# Patient Record
Sex: Female | Born: 1985 | Race: Black or African American | Hispanic: No | Marital: Single | Smoking: Current every day smoker
Health system: Southern US, Community
[De-identification: ages and names within clinical notes are randomized; demographics above are authoritative.]

## PROBLEM LIST (undated history)

## (undated) DIAGNOSIS — D649 Anemia, unspecified: Secondary | ICD-10-CM

## (undated) DIAGNOSIS — L309 Dermatitis, unspecified: Secondary | ICD-10-CM

## (undated) HISTORY — DX: Dermatitis, unspecified: L30.9

## (undated) HISTORY — PX: WISDOM TOOTH EXTRACTION: SHX21

## (undated) HISTORY — PX: NO PAST SURGERIES: SHX2092

---
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
## 2003-01-10 ENCOUNTER — Emergency Department (HOSPITAL_COMMUNITY): Admission: EM | Admit: 2003-01-10 | Discharge: 2003-01-10 | Payer: Self-pay

## 2003-01-12 ENCOUNTER — Emergency Department (HOSPITAL_COMMUNITY): Admission: EM | Admit: 2003-01-12 | Discharge: 2003-01-12 | Payer: Self-pay | Admitting: Emergency Medicine

## 2003-01-16 ENCOUNTER — Emergency Department (HOSPITAL_COMMUNITY): Admission: EM | Admit: 2003-01-16 | Discharge: 2003-01-16 | Payer: Self-pay | Admitting: Emergency Medicine

## 2003-01-16 ENCOUNTER — Encounter: Payer: Self-pay | Admitting: Emergency Medicine

## 2003-01-17 ENCOUNTER — Emergency Department (HOSPITAL_COMMUNITY): Admission: EM | Admit: 2003-01-17 | Discharge: 2003-01-17 | Payer: Self-pay | Admitting: Emergency Medicine

## 2003-01-19 ENCOUNTER — Emergency Department (HOSPITAL_COMMUNITY): Admission: EM | Admit: 2003-01-19 | Discharge: 2003-01-19 | Payer: Self-pay | Admitting: Emergency Medicine

## 2003-02-09 ENCOUNTER — Emergency Department (HOSPITAL_COMMUNITY): Admission: EM | Admit: 2003-02-09 | Discharge: 2003-02-09 | Payer: Self-pay | Admitting: Emergency Medicine

## 2004-02-04 ENCOUNTER — Emergency Department (HOSPITAL_COMMUNITY): Admission: EM | Admit: 2004-02-04 | Discharge: 2004-02-04 | Payer: Self-pay | Admitting: *Deleted

## 2005-10-13 ENCOUNTER — Emergency Department (HOSPITAL_COMMUNITY): Admission: EM | Admit: 2005-10-13 | Discharge: 2005-10-13 | Payer: Self-pay | Admitting: Emergency Medicine

## 2006-09-22 ENCOUNTER — Emergency Department (HOSPITAL_COMMUNITY): Admission: EM | Admit: 2006-09-22 | Discharge: 2006-09-22 | Payer: Self-pay | Admitting: Emergency Medicine

## 2006-10-31 ENCOUNTER — Inpatient Hospital Stay (HOSPITAL_COMMUNITY): Admission: AD | Admit: 2006-10-31 | Discharge: 2006-10-31 | Payer: Self-pay | Admitting: Obstetrics and Gynecology

## 2006-11-10 ENCOUNTER — Inpatient Hospital Stay (HOSPITAL_COMMUNITY): Admission: AD | Admit: 2006-11-10 | Discharge: 2006-11-10 | Payer: Self-pay | Admitting: Gynecology

## 2006-11-10 ENCOUNTER — Ambulatory Visit (HOSPITAL_COMMUNITY): Admission: RE | Admit: 2006-11-10 | Discharge: 2006-11-10 | Payer: Self-pay | Admitting: Obstetrics and Gynecology

## 2007-02-16 ENCOUNTER — Emergency Department (HOSPITAL_COMMUNITY): Admission: EM | Admit: 2007-02-16 | Discharge: 2007-02-16 | Payer: Self-pay | Admitting: *Deleted

## 2007-04-24 ENCOUNTER — Emergency Department (HOSPITAL_COMMUNITY): Admission: EM | Admit: 2007-04-24 | Discharge: 2007-04-24 | Payer: Self-pay | Admitting: Family Medicine

## 2007-12-12 ENCOUNTER — Emergency Department (HOSPITAL_COMMUNITY): Admission: EM | Admit: 2007-12-12 | Discharge: 2007-12-12 | Payer: Self-pay | Admitting: Emergency Medicine

## 2008-02-24 ENCOUNTER — Emergency Department (HOSPITAL_COMMUNITY): Admission: EM | Admit: 2008-02-24 | Discharge: 2008-02-24 | Payer: Self-pay | Admitting: Family Medicine

## 2008-12-20 ENCOUNTER — Emergency Department (HOSPITAL_COMMUNITY): Admission: EM | Admit: 2008-12-20 | Discharge: 2008-12-20 | Payer: Self-pay | Admitting: Family Medicine

## 2010-03-13 ENCOUNTER — Ambulatory Visit: Payer: Self-pay | Admitting: Nurse Practitioner

## 2010-03-13 ENCOUNTER — Inpatient Hospital Stay (HOSPITAL_COMMUNITY): Admission: AD | Admit: 2010-03-13 | Discharge: 2010-03-13 | Payer: Self-pay | Admitting: Obstetrics & Gynecology

## 2010-03-15 ENCOUNTER — Inpatient Hospital Stay (HOSPITAL_COMMUNITY): Admission: AD | Admit: 2010-03-15 | Discharge: 2010-03-15 | Payer: Self-pay | Admitting: Family Medicine

## 2010-03-15 ENCOUNTER — Ambulatory Visit: Payer: Self-pay | Admitting: Obstetrics and Gynecology

## 2010-03-20 ENCOUNTER — Ambulatory Visit (HOSPITAL_COMMUNITY): Admission: RE | Admit: 2010-03-20 | Discharge: 2010-03-20 | Payer: Self-pay | Admitting: Obstetrics & Gynecology

## 2010-03-20 ENCOUNTER — Ambulatory Visit: Payer: Self-pay | Admitting: Obstetrics and Gynecology

## 2010-03-27 ENCOUNTER — Inpatient Hospital Stay (HOSPITAL_COMMUNITY): Admission: RE | Admit: 2010-03-27 | Discharge: 2010-03-27 | Payer: Self-pay | Admitting: Obstetrics & Gynecology

## 2010-03-27 ENCOUNTER — Ambulatory Visit: Payer: Self-pay | Admitting: Nurse Practitioner

## 2010-04-17 DEATH — deceased

## 2010-05-21 ENCOUNTER — Emergency Department (HOSPITAL_COMMUNITY)
Admission: EM | Admit: 2010-05-21 | Discharge: 2010-05-21 | Payer: Self-pay | Source: Home / Self Care | Admitting: Emergency Medicine

## 2010-06-14 ENCOUNTER — Emergency Department (HOSPITAL_COMMUNITY)
Admission: EM | Admit: 2010-06-14 | Discharge: 2010-06-14 | Payer: Self-pay | Source: Home / Self Care | Admitting: Emergency Medicine

## 2010-06-18 ENCOUNTER — Emergency Department (HOSPITAL_COMMUNITY)
Admission: EM | Admit: 2010-06-18 | Discharge: 2010-06-18 | Payer: Self-pay | Source: Home / Self Care | Admitting: Family Medicine

## 2010-06-25 ENCOUNTER — Emergency Department (HOSPITAL_COMMUNITY)
Admission: EM | Admit: 2010-06-25 | Discharge: 2010-06-25 | Payer: Self-pay | Source: Home / Self Care | Admitting: Family Medicine

## 2010-07-31 ENCOUNTER — Inpatient Hospital Stay (HOSPITAL_COMMUNITY)
Admission: AD | Admit: 2010-07-31 | Discharge: 2010-07-31 | Disposition: A | Discharge status: Home / Self Care | Payer: Medicaid Other | Source: Ambulatory Visit | Attending: Obstetrics and Gynecology | Admitting: Obstetrics and Gynecology

## 2010-07-31 ENCOUNTER — Inpatient Hospital Stay (HOSPITAL_COMMUNITY): Payer: Medicaid Other

## 2010-07-31 ENCOUNTER — Encounter (HOSPITAL_COMMUNITY): Payer: Self-pay | Admitting: Radiology

## 2010-07-31 DIAGNOSIS — R109 Unspecified abdominal pain: Secondary | ICD-10-CM | POA: Insufficient documentation

## 2010-07-31 DIAGNOSIS — O98819 Other maternal infectious and parasitic diseases complicating pregnancy, unspecified trimester: Secondary | ICD-10-CM

## 2010-07-31 DIAGNOSIS — A5901 Trichomonal vulvovaginitis: Secondary | ICD-10-CM | POA: Insufficient documentation

## 2010-07-31 LAB — CBC
MCH: 31.9 pg (ref 26.0–34.0)
MCHC: 33.7 g/dL (ref 30.0–36.0)
Platelets: 197 10*3/uL (ref 150–400)
RBC: 3.7 MIL/uL — ABNORMAL LOW (ref 3.87–5.11)
RDW: 13 % (ref 11.5–15.5)

## 2010-07-31 LAB — URINALYSIS, ROUTINE W REFLEX MICROSCOPIC
Bilirubin Urine: NEGATIVE
Ketones, ur: 15 mg/dL — AB
Nitrite: NEGATIVE
Protein, ur: NEGATIVE mg/dL
Specific Gravity, Urine: 1.025 (ref 1.005–1.030)
Urobilinogen, UA: 0.2 mg/dL (ref 0.0–1.0)

## 2010-07-31 LAB — WET PREP, GENITAL: Yeast Wet Prep HPF POC: NONE SEEN

## 2010-08-28 LAB — POCT PREGNANCY, URINE: Preg Test, Ur: POSITIVE

## 2010-08-30 LAB — URINALYSIS, ROUTINE W REFLEX MICROSCOPIC
Nitrite: NEGATIVE
Specific Gravity, Urine: 1.025 (ref 1.005–1.030)
Urobilinogen, UA: 0.2 mg/dL (ref 0.0–1.0)
pH: 6 (ref 5.0–8.0)

## 2010-08-30 LAB — CBC
HCT: 38.3 % (ref 36.0–46.0)
Hemoglobin: 13.1 g/dL (ref 12.0–15.0)
MCH: 33.8 pg (ref 26.0–34.0)
MCHC: 34.2 g/dL (ref 30.0–36.0)
RBC: 3.88 MIL/uL (ref 3.87–5.11)

## 2010-08-30 LAB — GC/CHLAMYDIA PROBE AMP, GENITAL
Chlamydia, DNA Probe: NEGATIVE
GC Probe Amp, Genital: NEGATIVE

## 2010-08-30 LAB — ABO/RH: ABO/RH(D): B POS

## 2010-09-04 LAB — HEPATITIS B SURFACE ANTIGEN: Hepatitis B Surface Ag: NEGATIVE

## 2010-09-04 LAB — RPR: RPR: NONREACTIVE

## 2010-09-04 LAB — RUBELLA ANTIBODY, IGM: Rubella: IMMUNE

## 2011-01-03 LAB — STREP B DNA PROBE: GBS: POSITIVE

## 2011-01-23 ENCOUNTER — Inpatient Hospital Stay (HOSPITAL_COMMUNITY): Payer: Medicaid Other | Admitting: Anesthesiology

## 2011-01-23 ENCOUNTER — Encounter (HOSPITAL_COMMUNITY): Payer: Self-pay | Admitting: *Deleted

## 2011-01-23 ENCOUNTER — Inpatient Hospital Stay (HOSPITAL_COMMUNITY)
Admission: AD | Admit: 2011-01-23 | Discharge: 2011-01-25 | DRG: 775 | Disposition: A | Discharge status: Home / Self Care | Payer: Medicaid Other | Source: Ambulatory Visit | Attending: Obstetrics | Admitting: Obstetrics

## 2011-01-23 ENCOUNTER — Encounter (HOSPITAL_COMMUNITY): Payer: Self-pay | Admitting: Anesthesiology

## 2011-01-23 DIAGNOSIS — O99892 Other specified diseases and conditions complicating childbirth: Principal | ICD-10-CM | POA: Diagnosis present

## 2011-01-23 DIAGNOSIS — IMO0002 Reserved for concepts with insufficient information to code with codable children: Secondary | ICD-10-CM | POA: Diagnosis present

## 2011-01-23 DIAGNOSIS — Z22330 Carrier of Group B streptococcus: Secondary | ICD-10-CM

## 2011-01-23 DIAGNOSIS — Z3490 Encounter for supervision of normal pregnancy, unspecified, unspecified trimester: Secondary | ICD-10-CM

## 2011-01-23 HISTORY — DX: Anemia, unspecified: D64.9

## 2011-01-23 LAB — CBC
MCH: 33.6 pg (ref 26.0–34.0)
MCHC: 34.1 g/dL (ref 30.0–36.0)
Platelets: 160 10*3/uL (ref 150–400)
RBC: 3.81 MIL/uL — ABNORMAL LOW (ref 3.87–5.11)
RDW: 13 % (ref 11.5–15.5)

## 2011-01-23 LAB — RPR: RPR Ser Ql: NONREACTIVE

## 2011-01-23 MED ORDER — FENTANYL 2.5 MCG/ML BUPIVACAINE 1/10 % EPIDURAL INFUSION (WH - ANES)
14.0000 mL/h | INTRAMUSCULAR | Status: DC
  Administered 2011-01-23: 14 mL/h via EPIDURAL
  Filled 2011-01-23 (×2): qty 60

## 2011-01-23 MED ORDER — LORATADINE 10 MG PO TABS
10.0000 mg | ORAL_TABLET | Freq: Every day | ORAL | Status: DC | PRN
  Filled 2011-01-23: qty 1

## 2011-01-23 MED ORDER — OXYTOCIN 20 UNITS IN LACTATED RINGERS INFUSION - SIMPLE
1.0000 m[IU]/min | INTRAVENOUS | Status: DC
  Administered 2011-01-23: 1 m[IU]/min via INTRAVENOUS

## 2011-01-23 MED ORDER — CLINDAMYCIN PHOSPHATE 900 MG/50ML IV SOLN
900.0000 mg | Freq: Three times a day (TID) | INTRAVENOUS | Status: DC
  Administered 2011-01-23 (×2): 900 mg via INTRAVENOUS
  Filled 2011-01-23 (×4): qty 50

## 2011-01-23 MED ORDER — OXYTOCIN 20 UNITS IN LACTATED RINGERS INFUSION - SIMPLE
125.0000 mL/h | Freq: Once | INTRAVENOUS | Status: AC
  Administered 2011-01-24: 999 mL/h via INTRAVENOUS
  Filled 2011-01-23: qty 1000

## 2011-01-23 MED ORDER — PHENYLEPHRINE 40 MCG/ML (10ML) SYRINGE FOR IV PUSH (FOR BLOOD PRESSURE SUPPORT)
80.0000 ug | PREFILLED_SYRINGE | INTRAVENOUS | Status: DC | PRN
  Filled 2011-01-23: qty 5

## 2011-01-23 MED ORDER — LACTATED RINGERS IV SOLN
500.0000 mL | Freq: Once | INTRAVENOUS | Status: AC
  Administered 2011-01-23: 1000 mL via INTRAVENOUS

## 2011-01-23 MED ORDER — FLEET ENEMA 7-19 GM/118ML RE ENEM: 1.0000 | ENEMA | RECTAL | Status: DC | PRN

## 2011-01-23 MED ORDER — DIPHENHYDRAMINE HCL 50 MG/ML IJ SOLN: 12.5000 mg | INTRAMUSCULAR | Status: DC | PRN

## 2011-01-23 MED ORDER — LACTATED RINGERS IV SOLN: 500.0000 mL | INTRAVENOUS | Status: DC | PRN

## 2011-01-23 MED ORDER — OXYTOCIN 10 UNIT/ML IJ SOLN: 20.0000 [IU] | INTRAMUSCULAR | Status: DC

## 2011-01-23 MED ORDER — LACTATED RINGERS IV SOLN
INTRAVENOUS | Status: DC
  Administered 2011-01-23 (×2): via INTRAVENOUS

## 2011-01-23 MED ORDER — EPHEDRINE 5 MG/ML INJ
10.0000 mg | INTRAVENOUS | Status: DC | PRN
  Filled 2011-01-23 (×2): qty 4

## 2011-01-23 MED ORDER — CITRIC ACID-SODIUM CITRATE 334-500 MG/5ML PO SOLN: 30.0000 mL | ORAL | Status: DC | PRN

## 2011-01-23 MED ORDER — PHENYLEPHRINE 40 MCG/ML (10ML) SYRINGE FOR IV PUSH (FOR BLOOD PRESSURE SUPPORT)
80.0000 ug | PREFILLED_SYRINGE | INTRAVENOUS | Status: DC | PRN
  Filled 2011-01-23 (×2): qty 5

## 2011-01-23 MED ORDER — EPHEDRINE 5 MG/ML INJ
10.0000 mg | INTRAVENOUS | Status: DC | PRN
  Filled 2011-01-23: qty 4

## 2011-01-23 MED ORDER — PRENATAL PLUS 27-1 MG PO TABS
1.0000 | ORAL_TABLET | Freq: Every day | ORAL | Status: DC
  Administered 2011-01-24: 1 via ORAL
  Filled 2011-01-23: qty 1

## 2011-01-23 MED ORDER — NALBUPHINE SYRINGE 5 MG/0.5 ML
10.0000 mg | INJECTION | INTRAMUSCULAR | Status: DC | PRN
  Filled 2011-01-23: qty 1

## 2011-01-23 MED ORDER — NALBUPHINE SYRINGE 5 MG/0.5 ML
10.0000 mg | INJECTION | Freq: Four times a day (QID) | INTRAMUSCULAR | Status: DC | PRN
  Filled 2011-01-23: qty 1

## 2011-01-23 MED ORDER — OXYCODONE-ACETAMINOPHEN 5-325 MG PO TABS: 2.0000 | ORAL_TABLET | ORAL | Status: DC | PRN

## 2011-01-23 MED ORDER — LIDOCAINE HCL 1.5 % IJ SOLN
INTRAMUSCULAR | Status: DC | PRN
  Administered 2011-01-23 (×2): 5 mL via EPIDURAL
  Administered 2011-01-23: 2 mL via EPIDURAL

## 2011-01-23 MED ORDER — ONDANSETRON HCL 4 MG/2ML IJ SOLN: 4.0000 mg | Freq: Four times a day (QID) | INTRAMUSCULAR | Status: DC | PRN

## 2011-01-23 MED ORDER — HYDROXYZINE HCL 50 MG/ML IM SOLN
50.0000 mg | Freq: Four times a day (QID) | INTRAMUSCULAR | Status: DC | PRN
  Filled 2011-01-23: qty 1

## 2011-01-23 MED ORDER — LIDOCAINE HCL (PF) 1 % IJ SOLN
30.0000 mL | INTRAMUSCULAR | Status: DC | PRN
  Filled 2011-01-23 (×2): qty 30

## 2011-01-23 MED ORDER — ACETAMINOPHEN 325 MG PO TABS: 650.0000 mg | ORAL_TABLET | ORAL | Status: DC | PRN

## 2011-01-23 MED ORDER — TERBUTALINE SULFATE 1 MG/ML IJ SOLN: 0.2500 mg | Freq: Once | INTRAMUSCULAR | Status: AC | PRN

## 2011-01-23 MED ORDER — IBUPROFEN 600 MG PO TABS: 600.0000 mg | ORAL_TABLET | Freq: Four times a day (QID) | ORAL | Status: DC | PRN

## 2011-01-23 NOTE — Progress Notes (Signed)
Dr. Clearance Coots notified pt in MAU w/ruptured membranes, bloody clear fluid, efm tracing reactive, ctx's irregular, cervix 1/50/-3, orders for low dose pitocin, not to exceed , nubain 10mg  iv q2h, nubain im q6h, vistaril 50mg  im q6h, epidural in active labor upon request, treat for GBS positive. MD office faxing pt's prenatal record.

## 2011-01-23 NOTE — Progress Notes (Signed)
HAD PAIN AND PRESSURE YESTERDAY-   WHEN TO SLEEP- NO PAIN AND THIS MORNIN- PAIN .  NO PAIN NOW.

## 2011-01-23 NOTE — Progress Notes (Signed)
Around 4 to 5:00 a.m. Felt very wet in underwear, again leaked around 8:30 a.m. Light pink tinged, did put on a pad, cramping and pressure feeling

## 2011-01-23 NOTE — Plan of Care (Signed)
Problem: Consults Goal: Birthing Suites Patient Information Press F2 to bring up selections list Outcome: Completed/Met Date Met:  01/23/11  Pt 37-[redacted] weeks EGA and Inpatient induction

## 2011-01-23 NOTE — Progress Notes (Signed)
LYNSIE MCWATTERS is a 25 y.o. G4P1021 at [redacted]w[redacted]d by LMP admitted for rupture of membranes  Subjective:   Objective: BP 124/64  Pulse 72  Temp(Src) 98.4 F (36.9 C) (Oral)  Resp 20  Ht 5\' 6"  (1.676 m)  Wt 107.956 kg (238 lb)  BMI 38.41 kg/m2  LMP 04/25/2010      FHT:  FHR: 150 bpm, variability: moderate,  accelerations:  Present,  decelerations:  Absent UC:   irregular, every 2 - 5 minutes SVE:   Dilation: 1 Effacement (%): 50 Station: -3 Exam by:: Dr. Dimple Casey  Labs: Lab Results  Component Value Date   WBC 7.2 01/23/2011   HGB 12.8 01/23/2011   HCT 37.5 01/23/2011   MCV 98.4 01/23/2011   PLT 160 01/23/2011    Assessment / Plan: Augmentation of labor, progressing well  Labor: Progressing normally Preeclampsia:  n/a Fetal Wellbeing:  Category I Pain Control:  Nubain and Vistaril I/D:  n/a Anticipated MOD:  NSVD  HARPER,CHARLES A 01/23/2011, 5:39 PM

## 2011-01-23 NOTE — Progress Notes (Signed)
PT SAYS  AT 0500- AWOKE  AND HER PERINEUM WAS WET-.Marland Kitchen THEN  AT 0830- GETTING DRESSED FOR WORK - GUSH CAME OUT- THEN SAW PINK D/C.  CALLED DR.    NO SEX WITHIN 24 HRS.  VE IN OFFICE- 2 WEEKS AGO-  1 CM.  HAS APPOINTMENT ON Friday.

## 2011-01-23 NOTE — Anesthesia Preprocedure Evaluation (Signed)
Anesthesia Evaluation  Name, MR# and DOB Patient awake  General Assessment Comment  Reviewed: Allergy & Precautions, H&P , NPO status , Patient's Chart, lab work & pertinent test results, reviewed documented beta blocker date and time   History of Anesthesia Complications Negative for: history of anesthetic complications  Airway Mallampati: III TM Distance: >3 FB Neck ROM: full    Dental  (+) Teeth Intact   Pulmonary  clear to auscultation  breath sounds clear to auscultation none    Cardiovascular regular Normal    Neuro/Psych Negative Neurological ROS  Negative Psych ROS  GI/Hepatic/Renal negative Liver ROS, and negative Renal ROS (+)  GERD Medicated     Endo/Other  (+)   Morbid obesity  Abdominal   Musculoskeletal   Hematology negative hematology ROS (+)   Peds  Reproductive/Obstetrics (+) Pregnancy    Anesthesia Other Findings             Anesthesia Physical Anesthesia Plan  ASA: III  Anesthesia Plan: Epidural   Post-op Pain Management:    Induction:   Airway Management Planned:   Additional Equipment:   Intra-op Plan:   Post-operative Plan:   Informed Consent: I have reviewed the patients History and Physical, chart, labs and discussed the procedure including the risks, benefits and alternatives for the proposed anesthesia with the patient or authorized representative who has indicated his/her understanding and acceptance.     Plan Discussed with:   Anesthesia Plan Comments:         Anesthesia Quick Evaluation

## 2011-01-23 NOTE — Progress Notes (Signed)
I was asked to do a speculum exam to r/o SROM. Fern test was positive. Cervix 1/50/-3.

## 2011-01-23 NOTE — H&P (Signed)
Elizabeth Alvarez is a 25 y.o. female presenting for SROM. Maternal Medical History:  Reason for admission: Reason for admission: rupture of membranes.  Contractions: Onset was 3-5 hours ago.   Frequency: regular.   Duration is approximately 1 minute.   Perceived severity is mild.    Fetal activity: Perceived fetal activity is normal.   Last perceived fetal movement was within the past hour.    Prenatal complications: no prenatal complications Prenatal Complications - Diabetes: none.    OB History    Grav Para Term Preterm Abortions TAB SAB Ect Mult Living   4 1 1  2 1 1   1      Past Medical History  Diagnosis Date  . Anemia    Past Surgical History  Procedure Date  . No past surgeries    Family History: family history includes Diabetes in her mother and Heart disease in her father and mother. Social History:  reports that she has never smoked. She has never used smokeless tobacco. She reports that she does not drink alcohol or use illicit drugs.  Review of Systems  All other systems reviewed and are negative.    Dilation: 1 Effacement (%): 50 Station: -3 Exam by:: Dr. Dimple Casey Blood pressure 131/57, pulse 70, temperature 98.4 F (36.9 C), temperature source Oral, resp. rate 20, height 5\' 6"  (1.676 m), weight 107.956 kg (238 lb), last menstrual period 04/25/2010. Maternal Exam:  Uterine Assessment: Contraction strength is mild.  Contraction duration is 1 minute. Contraction frequency is irregular.   Abdomen: Patient reports no abdominal tenderness. Fetal presentation: vertex  Introitus: Normal vulva. Normal vagina.  Ferning test: positive.  Amniotic fluid character: bloody.  Pelvis: adequate for delivery.   Cervix: Cervix evaluated by digital exam.     Physical Exam  Nursing note and vitals reviewed. Constitutional: She is oriented to person, place, and time. She appears well-developed and well-nourished.  HENT:  Head: Normocephalic and atraumatic.  Eyes:  Conjunctivae and EOM are normal. Pupils are equal, round, and reactive to light.  Neck: Normal range of motion. Neck supple.  Cardiovascular: Normal rate and regular rhythm.   Respiratory: Effort normal and breath sounds normal.  GI: Soft.  Genitourinary: Vagina normal and uterus normal.  Musculoskeletal: Normal range of motion.  Neurological: She is alert and oriented to person, place, and time. She has normal reflexes.  Skin: Skin is warm and dry.  Psychiatric: She has a normal mood and affect. Her behavior is normal. Judgment and thought content normal.    Prenatal labs: ABO, Rh: B POS (09/27 1600) Antibody: Negative (03/20 0000) Rubella:   RPR: Nonreactive (03/20 0000)  HBsAg: Negative (03/20 0000)  HIV: Non-reactive (03/20 0000)  GBS: Positive (07/19 0000)   Assessment/Plan: Term pregnancy.  SROM.  Latent phase of labor.  GBS +.  Start GBS prophylaxis.  Pitocin, low dose.   HARPER,CHARLES A 01/23/2011, 5:16 PM

## 2011-01-23 NOTE — Anesthesia Procedure Notes (Signed)
Epidural Patient location during procedure: OB Start time: 01/23/2011 10:05 PM Reason for block: procedure for pain  Staffing Performed by: anesthesiologist   Preanesthetic Checklist Completed: patient identified, site marked, surgical consent, pre-op evaluation, timeout performed, IV checked, risks and benefits discussed and monitors and equipment checked  Epidural Patient position: sitting Prep: site prepped and draped and DuraPrep Patient monitoring: continuous pulse ox and blood pressure Approach: midline Injection technique: LOR air  Needle:  Needle type: Tuohy  Needle gauge: 17 G Needle length: 9 cm Needle insertion depth: 6 cm Catheter type: closed end flexible Catheter size: 19 Gauge Catheter at skin depth: 11 cm Test dose: negative  Assessment Events: blood not aspirated, injection not painful, no injection resistance, negative IV test and no paresthesia  Additional Notes Discussed risk of headache, infection, bleeding, nerve injury and failed or incomplete block.  Patient voices understanding and wishes to proceed.

## 2011-01-24 ENCOUNTER — Encounter (HOSPITAL_COMMUNITY): Payer: Self-pay | Admitting: *Deleted

## 2011-01-24 MED ORDER — BENZOCAINE-MENTHOL 20-0.5 % EX AERO
1.0000 "application " | INHALATION_SPRAY | CUTANEOUS | Status: DC | PRN
  Administered 2011-01-24: 12:00:00 via TOPICAL

## 2011-01-24 MED ORDER — SENNOSIDES-DOCUSATE SODIUM 8.6-50 MG PO TABS: 2.0000 | ORAL_TABLET | Freq: Every day | ORAL | Status: DC

## 2011-01-24 MED ORDER — OXYCODONE-ACETAMINOPHEN 5-325 MG PO TABS
1.0000 | ORAL_TABLET | ORAL | Status: DC | PRN
  Administered 2011-01-24 – 2011-01-25 (×3): 1 via ORAL
  Filled 2011-01-24 (×4): qty 1

## 2011-01-24 MED ORDER — BENZOCAINE-MENTHOL 20-0.5 % EX AERO: 1.0000 "application " | INHALATION_SPRAY | CUTANEOUS | Status: DC | PRN

## 2011-01-24 MED ORDER — TETANUS-DIPHTH-ACELL PERTUSSIS 5-2.5-18.5 LF-MCG/0.5 IM SUSP
0.5000 mL | Freq: Once | INTRAMUSCULAR | Status: AC
Start: 2011-01-25 — End: 2011-01-25
  Administered 2011-01-25: 0.5 mL via INTRAMUSCULAR

## 2011-01-24 MED ORDER — WITCH HAZEL-GLYCERIN EX PADS: 1.0000 "application " | MEDICATED_PAD | CUTANEOUS | Status: DC | PRN

## 2011-01-24 MED ORDER — DIBUCAINE 1 % RE OINT: 1.0000 "application " | TOPICAL_OINTMENT | RECTAL | Status: DC | PRN

## 2011-01-24 MED ORDER — ZOLPIDEM TARTRATE 5 MG PO TABS: 5.0000 mg | ORAL_TABLET | Freq: Every evening | ORAL | Status: DC | PRN

## 2011-01-24 MED ORDER — DIPHENHYDRAMINE HCL 25 MG PO CAPS: 25.0000 mg | ORAL_CAPSULE | Freq: Four times a day (QID) | ORAL | Status: DC | PRN

## 2011-01-24 MED ORDER — MEASLES, MUMPS & RUBELLA VAC ~~LOC~~ INJ: 0.5000 mL | INJECTION | Freq: Once | SUBCUTANEOUS | Status: DC

## 2011-01-24 MED ORDER — METHYLERGONOVINE MALEATE 0.2 MG PO TABS: 0.2000 mg | ORAL_TABLET | ORAL | Status: DC | PRN

## 2011-01-24 MED ORDER — SIMETHICONE 80 MG PO CHEW: 80.0000 mg | CHEWABLE_TABLET | ORAL | Status: DC | PRN

## 2011-01-24 MED ORDER — LANOLIN HYDROUS EX OINT: TOPICAL_OINTMENT | CUTANEOUS | Status: DC | PRN

## 2011-01-24 MED ORDER — SENNOSIDES-DOCUSATE SODIUM 8.6-50 MG PO TABS
2.0000 | ORAL_TABLET | Freq: Every day | ORAL | Status: DC
  Administered 2011-01-24: 2 via ORAL

## 2011-01-24 MED ORDER — ONDANSETRON HCL 4 MG PO TABS: 4.0000 mg | ORAL_TABLET | ORAL | Status: DC | PRN

## 2011-01-24 MED ORDER — MEDROXYPROGESTERONE ACETATE 150 MG/ML IM SUSP: 150.0000 mg | INTRAMUSCULAR | Status: DC | PRN

## 2011-01-24 MED ORDER — OXYTOCIN 10 UNIT/ML IJ SOLN
INTRAMUSCULAR | Status: AC
  Administered 2011-01-24: 20 [IU]
  Filled 2011-01-24: qty 2

## 2011-01-24 MED ORDER — ONDANSETRON HCL 4 MG/2ML IJ SOLN: 4.0000 mg | INTRAMUSCULAR | Status: DC | PRN

## 2011-01-24 MED ORDER — TETANUS-DIPHTH-ACELL PERTUSSIS 5-2.5-18.5 LF-MCG/0.5 IM SUSP: 0.5000 mL | Freq: Once | INTRAMUSCULAR | Status: DC

## 2011-01-24 MED ORDER — METHYLERGONOVINE MALEATE 0.2 MG/ML IJ SOLN: 0.2000 mg | INTRAMUSCULAR | Status: DC | PRN

## 2011-01-24 MED ORDER — OXYTOCIN 20 UNITS IN LACTATED RINGERS INFUSION - SIMPLE: 125.0000 mL/h | INTRAVENOUS | Status: DC | PRN

## 2011-01-24 MED ORDER — BENZOCAINE-MENTHOL 20-0.5 % EX AERO
INHALATION_SPRAY | CUTANEOUS | Status: AC
  Filled 2011-01-24: qty 56

## 2011-01-24 NOTE — Progress Notes (Signed)
UR chart review completed.  

## 2011-01-24 NOTE — Progress Notes (Signed)
Elizabeth Alvarez is a 25 y.o. G4P1021 at [redacted]w[redacted]d by LMP admitted for rupture of membranes  Subjective:   Objective: BP 121/59  Pulse 90  Temp(Src) 97.9 F (36.6 C) (Oral)  Resp 18  Ht 5\' 6"  (1.676 m)  Wt 107.956 kg (238 lb)  BMI 38.41 kg/m2  SpO2 99%  LMP 04/25/2010      FHT:  FHR: 150 bpm, variability: moderate,  accelerations:  Present,  decelerations:  Absent UC:   regular, every 3 minutes SVE:   Dilation: 10 Effacement (%): 100 Station: +3;+2 Exam by:: Penley,RN   Labs: Lab Results  Component Value Date   WBC 7.2 01/23/2011   HGB 12.8 01/23/2011   HCT 37.5 01/23/2011   MCV 98.4 01/23/2011   PLT 160 01/23/2011    Assessment / Plan: Augmentation of labor, progressing well  Labor: Progressing normally Preeclampsia:  n/a Fetal Wellbeing:  Category I Pain Control:  Epidural I/D:  n/a Anticipated MOD:  NSVD  Teal Raben A 01/24/2011, 4:34 AM

## 2011-01-24 NOTE — Anesthesia Postprocedure Evaluation (Signed)
  Anesthesia Post-op Note  Patient: Elizabeth Alvarez  Procedure(s) Performed: * No procedures listed *  Patient Location: PACU and Mother/Baby  Anesthesia Type: Epidural  Level of Consciousness: awake and alert   Airway and Oxygen Therapy: Patient Spontanous Breathing   Post-op Assessment: Post-op Vital signs reviewed and Patient's Cardiovascular Status Stable  Post-op Vital Signs: Reviewed and stable  Complications: No apparent anesthesia complications

## 2011-01-24 NOTE — Procedures (Signed)
Delivery Note At 4:46 AM a viable female was delivered via Vaginal, Spontaneous Delivery (Presentation: Left Occiput Anterior).  APGAR: 8, 9; weight 7 lb 7 oz (3374 g).   Placenta status: Intact, Spontaneous.  Cord: 3 vessels with the following complications: None.  Cord pH: n/a  Anesthesia: Epidural  Episiotomy: None Lacerations: None Suture Repair: n/a Est. Blood Loss (mL): 400  Mom to postpartum.  Baby to nursery-stable.  Marlaina Coburn A 01/24/2011, 8:19 AM

## 2011-01-25 MED ORDER — OXYCODONE-ACETAMINOPHEN 5-325 MG PO TABS: 1.0000 | ORAL_TABLET | ORAL | Status: DC | PRN

## 2011-01-25 NOTE — Progress Notes (Signed)
Post Partum Day 1 Subjective: no complaints, up ad lib, voiding, tolerating PO and + flatus  Objective: Blood pressure 122/72, pulse 85, temperature 97.4 F (36.3 C), temperature source Oral, resp. rate 18, height 5\' 6"  (1.676 m), weight 107.956 kg (238 lb), last menstrual period 04/25/2010, SpO2 99.00%, unknown if currently breastfeeding.  Physical Exam:  General: alert and no distress Lochia: appropriate Uterine Fundus: firm Incision: n/a DVT Evaluation: No evidence of DVT seen on physical exam.   Basename 01/23/11 1406  HGB 12.8  HCT 37.5    Assessment/Plan: Discharge home   LOS: 2 days   HARPER,CHARLES A 01/25/2011, 10:05 AM

## 2011-01-25 NOTE — Discharge Summary (Signed)
Obstetric Discharge Summary Reason for Admission: rupture of membranes Prenatal Procedures: ultrasound Intrapartum Procedures: spontaneous vaginal delivery Postpartum Procedures: none Complications-Operative and Postpartum: none Hemoglobin  Date Value Range Status  01/23/2011 12.8  12.0-15.0 (g/dL) Final     HCT  Date Value Range Status  01/23/2011 37.5  36.0-46.0 (%) Final    Discharge Diagnoses: Term Pregnancy-delivered  Discharge Information: Date: 01/25/2011 Activity: pelvic rest Diet: routine Medications: PNV and Percocet Condition: stable Instructions: refer to practice specific booklet Discharge to: home Follow-up Information    Follow up with Elizabeth Babe A, MD. Make an appointment in 6 weeks.   Contact information:   [address] [place] [address] [phone]          Newborn Data: Live born female  Birth Weight: 7 lb 7 oz (3374 g) APGAR: 8, 9  Home with mother.  Elizabeth Alvarez 01/25/2011, 10:07 AM

## 2011-02-25 ENCOUNTER — Other Ambulatory Visit: Payer: Self-pay | Admitting: Obstetrics & Gynecology

## 2011-02-26 ENCOUNTER — Other Ambulatory Visit: Payer: Self-pay | Admitting: Obstetrics & Gynecology

## 2011-03-20 LAB — POCT URINALYSIS DIP (DEVICE)
Glucose, UA: NEGATIVE
Hgb urine dipstick: NEGATIVE
Nitrite: NEGATIVE
Protein, ur: NEGATIVE
Urobilinogen, UA: 0.2
pH: 7

## 2011-03-20 LAB — GC/CHLAMYDIA PROBE AMP, GENITAL: GC Probe Amp, Genital: NEGATIVE

## 2011-03-20 LAB — WET PREP, GENITAL
Trich, Wet Prep: NONE SEEN
Yeast Wet Prep HPF POC: NONE SEEN

## 2011-03-20 LAB — POCT PREGNANCY, URINE: Preg Test, Ur: NEGATIVE

## 2011-03-22 ENCOUNTER — Encounter (HOSPITAL_COMMUNITY): Payer: Self-pay

## 2011-03-22 ENCOUNTER — Encounter (HOSPITAL_COMMUNITY)
Admission: RE | Admit: 2011-03-22 | Discharge: 2011-03-22 | Disposition: A | Discharge status: Home / Self Care | Payer: Medicaid Other | Source: Ambulatory Visit | Attending: Obstetrics & Gynecology | Admitting: Obstetrics & Gynecology

## 2011-03-22 LAB — CBC
MCH: 32.5 pg (ref 26.0–34.0)
MCHC: 33.2 g/dL (ref 30.0–36.0)
Platelets: 196 10*3/uL (ref 150–400)
RDW: 12.4 % (ref 11.5–15.5)

## 2011-03-22 LAB — SURGICAL PCR SCREEN: MRSA, PCR: NEGATIVE

## 2011-03-22 NOTE — Patient Instructions (Signed)
   Your procedure is scheduled on:03/29/11  Enter through the Main Entrance of [facility] at:1130am Pick up the phone at the desk and dial [phone] and inform us of your arrival  Please call this number if you have any problems the morning of surgery: [phone]  Remember: Do not eat food after midnight  Do not drink clear liquids after:9am Fri Take these medicines the morning of surgery with a SIP OF WATER: none  Do not wear jewelry, make-up, or FINGER nail polish Do not wear lotions, powders, or perfumes.  You may wear deodorant. Do not shave 48 hours prior to surgery. Do not bring valuables to the hospital. Leave suitcase in the car. After Surgery it may be brought to your room. For patients being admitted to the hospital, checkout time is 11:00am the day of discharge.  Patients discharged on the day of surgery will not be allowed to drive home.   Name and phone number of your driver: Artis Flock- [phone]   Remember to use your hibiclens as instructed.Please shower with 1/2 bottle the evening before your surgery and the other 1/2 bottle the morning of surgery.

## 2011-03-29 ENCOUNTER — Ambulatory Visit (HOSPITAL_COMMUNITY)
Admission: RE | Admit: 2011-03-29 | Discharge: 2011-03-29 | Disposition: A | Discharge status: Home / Self Care | Payer: Medicaid Other | Source: Ambulatory Visit | Attending: Obstetrics & Gynecology | Admitting: Obstetrics & Gynecology

## 2011-03-29 ENCOUNTER — Encounter (HOSPITAL_COMMUNITY): Payer: Self-pay | Admitting: Anesthesiology

## 2011-03-29 ENCOUNTER — Ambulatory Visit (HOSPITAL_COMMUNITY): Payer: Medicaid Other | Admitting: Anesthesiology

## 2011-03-29 ENCOUNTER — Encounter (HOSPITAL_COMMUNITY)
Admission: RE | Disposition: A | Discharge status: Home / Self Care | Payer: Self-pay | Source: Ambulatory Visit | Attending: Obstetrics & Gynecology

## 2011-03-29 ENCOUNTER — Encounter (HOSPITAL_COMMUNITY): Payer: Self-pay | Admitting: Obstetrics & Gynecology

## 2011-03-29 DIAGNOSIS — Z01812 Encounter for preprocedural laboratory examination: Secondary | ICD-10-CM | POA: Insufficient documentation

## 2011-03-29 DIAGNOSIS — Z309 Encounter for contraceptive management, unspecified: Secondary | ICD-10-CM

## 2011-03-29 DIAGNOSIS — Z302 Encounter for sterilization: Secondary | ICD-10-CM | POA: Insufficient documentation

## 2011-03-29 DIAGNOSIS — Z01818 Encounter for other preprocedural examination: Secondary | ICD-10-CM | POA: Insufficient documentation

## 2011-03-29 DIAGNOSIS — Z641 Problems related to multiparity: Secondary | ICD-10-CM | POA: Insufficient documentation

## 2011-03-29 HISTORY — PX: LAPAROSCOPIC TUBAL LIGATION: SHX1937

## 2011-03-29 SURGERY — LIGATION, FALLOPIAN TUBE, LAPAROSCOPIC
Anesthesia: General | Site: Abdomen | Laterality: Bilateral | Wound class: Clean Contaminated

## 2011-03-29 MED ORDER — LIDOCAINE HCL (CARDIAC) 20 MG/ML IV SOLN
INTRAVENOUS | Status: AC
  Filled 2011-03-29: qty 5

## 2011-03-29 MED ORDER — DEXAMETHASONE SODIUM PHOSPHATE 4 MG/ML IJ SOLN
INTRAMUSCULAR | Status: DC | PRN
  Administered 2011-03-29: 10 mg via INTRAVENOUS

## 2011-03-29 MED ORDER — MIDAZOLAM HCL 5 MG/5ML IJ SOLN
INTRAMUSCULAR | Status: DC | PRN
  Administered 2011-03-29: 1 mg via INTRAVENOUS

## 2011-03-29 MED ORDER — ONDANSETRON HCL 4 MG/2ML IJ SOLN
INTRAMUSCULAR | Status: AC
  Filled 2011-03-29: qty 2

## 2011-03-29 MED ORDER — OXYCODONE-ACETAMINOPHEN 5-325 MG PO TABS: 2.0000 | ORAL_TABLET | Freq: Four times a day (QID) | ORAL | Status: DC | PRN

## 2011-03-29 MED ORDER — LIDOCAINE HCL (CARDIAC) 20 MG/ML IV SOLN
INTRAVENOUS | Status: DC | PRN
  Administered 2011-03-29: 50 mg via INTRAVENOUS

## 2011-03-29 MED ORDER — PROPOFOL 10 MG/ML IV EMUL
INTRAVENOUS | Status: AC
  Filled 2011-03-29: qty 20

## 2011-03-29 MED ORDER — MIDAZOLAM HCL 2 MG/2ML IJ SOLN
INTRAMUSCULAR | Status: AC
  Filled 2011-03-29: qty 2

## 2011-03-29 MED ORDER — DEXAMETHASONE SODIUM PHOSPHATE 10 MG/ML IJ SOLN
INTRAMUSCULAR | Status: AC
  Filled 2011-03-29: qty 1

## 2011-03-29 MED ORDER — NEOSTIGMINE METHYLSULFATE 1 MG/ML IJ SOLN
INTRAMUSCULAR | Status: DC | PRN
  Administered 2011-03-29: 3 mg via INTRAVENOUS

## 2011-03-29 MED ORDER — GLYCOPYRROLATE 0.2 MG/ML IJ SOLN
INTRAMUSCULAR | Status: DC | PRN
  Administered 2011-03-29: .5 mg via INTRAVENOUS

## 2011-03-29 MED ORDER — BUPIVACAINE HCL (PF) 0.25 % IJ SOLN
INTRAMUSCULAR | Status: DC | PRN
  Administered 2011-03-29: 10 mL

## 2011-03-29 MED ORDER — ROCURONIUM BROMIDE 50 MG/5ML IV SOLN
INTRAVENOUS | Status: AC
  Filled 2011-03-29: qty 1

## 2011-03-29 MED ORDER — PROPOFOL 10 MG/ML IV EMUL
INTRAVENOUS | Status: DC | PRN
  Administered 2011-03-29: 200 mg via INTRAVENOUS

## 2011-03-29 MED ORDER — LACTATED RINGERS IV SOLN
INTRAVENOUS | Status: DC
  Administered 2011-03-29 (×2): via INTRAVENOUS
  Administered 2011-03-29: 1000 mL via INTRAVENOUS

## 2011-03-29 MED ORDER — FENTANYL CITRATE 0.05 MG/ML IJ SOLN
INTRAMUSCULAR | Status: AC
  Filled 2011-03-29: qty 10

## 2011-03-29 MED ORDER — FENTANYL CITRATE 0.05 MG/ML IJ SOLN
INTRAMUSCULAR | Status: DC | PRN
  Administered 2011-03-29: 50 ug via INTRAVENOUS
  Administered 2011-03-29: 100 ug via INTRAVENOUS
  Administered 2011-03-29 (×2): 50 ug via INTRAVENOUS

## 2011-03-29 MED ORDER — ACETAMINOPHEN 10 MG/ML IV SOLN
1000.0000 mg | Freq: Once | INTRAVENOUS | Status: AC
  Administered 2011-03-29: 1000 mg via INTRAVENOUS
  Filled 2011-03-29: qty 100

## 2011-03-29 MED ORDER — ONDANSETRON HCL 4 MG/2ML IJ SOLN
INTRAMUSCULAR | Status: DC | PRN
  Administered 2011-03-29: 4 mg via INTRAVENOUS

## 2011-03-29 MED ORDER — ROCURONIUM BROMIDE 100 MG/10ML IV SOLN
INTRAVENOUS | Status: DC | PRN
  Administered 2011-03-29: 40 mg via INTRAVENOUS

## 2011-03-29 SURGICAL SUPPLY — 14 items
CATH ROBINSON RED A/P 16FR (CATHETERS) ×2 IMPLANT
CHLORAPREP W/TINT 26ML (MISCELLANEOUS) ×2 IMPLANT
CLOTH BEACON ORANGE TIMEOUT ST (SAFETY) ×2 IMPLANT
DERMABOND ADVANCED (GAUZE/BANDAGES/DRESSINGS) ×1
DERMABOND ADVANCED .7 DNX12 (GAUZE/BANDAGES/DRESSINGS) ×1 IMPLANT
GLOVE BIO SURGEON STRL SZ 6.5 (GLOVE) ×4 IMPLANT
GOWN PREVENTION PLUS LG XLONG (DISPOSABLE) ×4 IMPLANT
PACK LAPAROSCOPY BASIN (CUSTOM PROCEDURE TRAY) ×2 IMPLANT
SUT VIC AB 3-0 PS2 18 (SUTURE)
SUT VIC AB 3-0 PS2 18XBRD (SUTURE) IMPLANT
SUT VICRYL 0 UR6 27IN ABS (SUTURE) IMPLANT
TOWEL OR 17X24 6PK STRL BLUE (TOWEL DISPOSABLE) ×4 IMPLANT
TROCAR XCEL NON-BLD 11X100MML (ENDOMECHANICALS) ×2 IMPLANT
WATER STERILE IRR 1000ML POUR (IV SOLUTION) ×2 IMPLANT

## 2011-03-29 NOTE — Op Note (Signed)
Procedure Note  Elizabeth Alvarez 25 y.o. 03/29/2011  Preoperative Diagnosis:  Multiparity, desires a sterilization procedure  Postoperative Diagnosis: Same  Procedure: Laparoscopic bilateral tubal ligation with fulguration  Surgeon: Antionette Char A  Indications:  The patient now presents for a sterilization procedure after discussing therapeutic alternatives.        Procedure Detail:  The patient was taken to the operating room and was placed on the operating table in the dorsal supine position.  After his satisfactory general anesthesia was achieved, the patient was placed in the semi-lithotomy position using Allen stirrups. The patient was prepped and draped in the usual sterile manner for vaginal laparoscopic procedure. A speculum was placed in the vagina. The anterior lip of the cervix was grasped with a single-tooth tenaculum. A Hulka manipulator was then advanced into the uterus and secured  to the anterior lip of the cervix as a means to manipulate the uterus. The single-tooth tenaculum and Hulka manipulator were then removed. The infraumbilical region was then anesthetized with local anesthesia, quarter percent Artane. A small incision was made to the skin and subcutaneous tissue. A 10 mm Optiview trocar was placed through the incision into the abdominal cavity diagnostic laparoscope with video camera attached was placed through the trocar sleeve and carbon dioxide was used to insufflate the abdominal and pelvic cavity. The pelvic contents were examined and the findings were described above. Kleppinger bipolar forceps were inserted through the operative port of the laparoscope. The left fallopian tube was identified and traced out to its fimbriated end. Then starting at the distal isthmus the proximal ampullary portion of the tube was coagulated in 4 contiguous areas. Each time the resistance meter went to 0 and the tube had been retracted away from an adjacent viscera. The right  fallopian tube was then manipulated in a similar fashion. A 1 cm paratubal cyst was ruptured using endo shears.The scope was removed and the excess carbon dioxide was remove through the port, before it was removed.  The subcutaneous layer of the incision was reapproximated with an interrupted figure-of eight 0-Vicryl suture on a UR 6 needle. The skin was reapproximated with an adhesive. The instruments removed from the vagina there is minimal bleeding from the cervix. Final sponge, instrument and needle counts were correct. The patient was awakened on the operating table and taken to the PACU in satisfactory condition.    Findings: See above.  Essentially a normal anatomic survey  Estimated Blood Loss:  Minimal              Total IV Fluids:  per Anesthesiology        Condition: stable

## 2011-03-29 NOTE — Anesthesia Postprocedure Evaluation (Signed)
Anesthesia Post Note  Patient: Elizabeth Alvarez  Procedure(s) Performed:  LAPAROSCOPIC TUBAL LIGATION  Anesthesia type: General  Patient location: PACU  Post pain: Pain level controlled  Post assessment: Post-op Vital signs reviewed  Last Vitals:  Filed Vitals:   03/29/11 1330  BP: 123/66  Pulse: 59  Temp:   Resp: 16    Post vital signs: Reviewed  Level of consciousness: sedated  Complications: No apparent anesthesia complications

## 2011-03-29 NOTE — Transfer of Care (Signed)
Immediate Anesthesia Transfer of Care Note  Patient: Elizabeth Alvarez  Procedure(s) Performed:  LAPAROSCOPIC TUBAL LIGATION  Patient Location: PACU  Anesthesia Type: General  Level of Consciousness: awake, alert  and oriented  Airway & Oxygen Therapy: Patient Spontanous Breathing and Patient connected to nasal cannula oxygen  Post-op Assessment: Report given to PACU RN and Post -op Vital signs reviewed and stable  Post vital signs: Reviewed and stable  Complications: No apparent anesthesia complications

## 2011-03-29 NOTE — H&P (Signed)
  Chief Complaint: 25 y.o. who presents for a laparoscopic BTL  Details of Present Illness: See above.  Breastfeeding? No  Past Medical History  Diagnosis Date  . Anemia     in the past   History   Social History  . Marital Status: Single    Spouse Name: N/A    Number of Children: N/A  . Years of Education: N/A   Occupational History  . Not on file.   Social History Main Topics  . Smoking status: Current Some Day Smoker -- 0.2 packs/day    Types: Cigarettes  . Smokeless tobacco: Never Used  . Alcohol Use: No  . Drug Use: No  . Sexually Active: Yes     would like to speak with MD    Other Topics Concern  . Not on file   Social History Narrative  . No narrative on file   Family History  Problem Relation Age of Onset  . Diabetes Mother   . Heart disease Mother   . Heart disease Father     Pertinent items are noted in HPI.  Pre-Op Diagnosis: Desires sterilization   Planned Procedure: Procedure(s): LAPAROSCOPIC TUBAL LIGATION  I have reviewed the patient's history and have completed the physical exam and Elizabeth Alvarez is acceptable for surgery.  Roseanna Rainbow, MD 03/29/2011 9:33 AM

## 2011-03-29 NOTE — Anesthesia Preprocedure Evaluation (Signed)
Anesthesia Evaluation  Name, MR# and DOB Patient awake  General Assessment Comment  Reviewed: Allergy & Precautions, H&P , NPO status , Patient's Chart, lab work & pertinent test results, reviewed documented beta blocker date and time   History of Anesthesia Complications Negative for: history of anesthetic complications  Airway Mallampati: II TM Distance: >3 FB Neck ROM: full    Dental  (+) Teeth Intact   Pulmonary Current Smoker  clear to auscultation        Cardiovascular Exercise Tolerance: Good regular Normal    Neuro/Psych Negative Neurological ROS  Negative Psych ROS   GI/Hepatic negative GI ROS Neg liver ROS    Endo/Other  Negative Endocrine ROS  Renal/GU negative Renal ROS  Genitourinary negative   Musculoskeletal   Abdominal   Peds  Hematology negative hematology ROS (+)   Anesthesia Other Findings   Reproductive/Obstetrics negative OB ROS                           Anesthesia Physical Anesthesia Plan  ASA: I  Anesthesia Plan: General   Post-op Pain Management:    Induction:   Airway Management Planned:   Additional Equipment:   Intra-op Plan:   Post-operative Plan:   Informed Consent: I have reviewed the patients History and Physical, chart, labs and discussed the procedure including the risks, benefits and alternatives for the proposed anesthesia with the patient or authorized representative who has indicated his/her understanding and acceptance.   Dental Advisory Given  Plan Discussed with: CRNA and Surgeon  Anesthesia Plan Comments:         Anesthesia Quick Evaluation

## 2011-03-31 ENCOUNTER — Encounter (HOSPITAL_COMMUNITY): Payer: Self-pay | Admitting: Obstetrics & Gynecology

## 2012-01-30 IMAGING — US US OB TRANSVAGINAL MODIFY
1 series · 14 of 28 positions shown · non-contrast
Comparison: none

OBSTETRICAL ULTRASOUND:
 This ultrasound exam was performed in the [HOSPITAL] Ultrasound Department.  The OB US report was generated in the AS system, and faxed to the ordering physician.  This report is also available in [HOSPITAL]?s AccessANYware and in [REDACTED] PACS.

[Series 1: us ob comp less 14 wks · 0.20mm/px · 61 acquisitions, 14 frames shown]
[im 3/61]
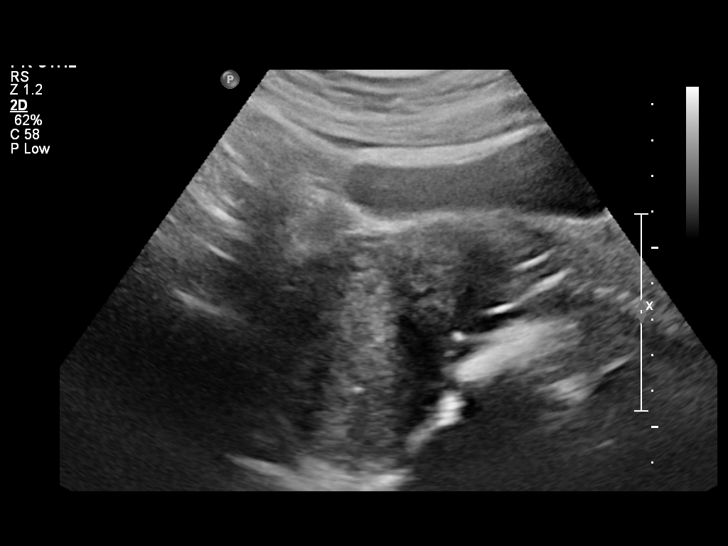
[im 7/61]
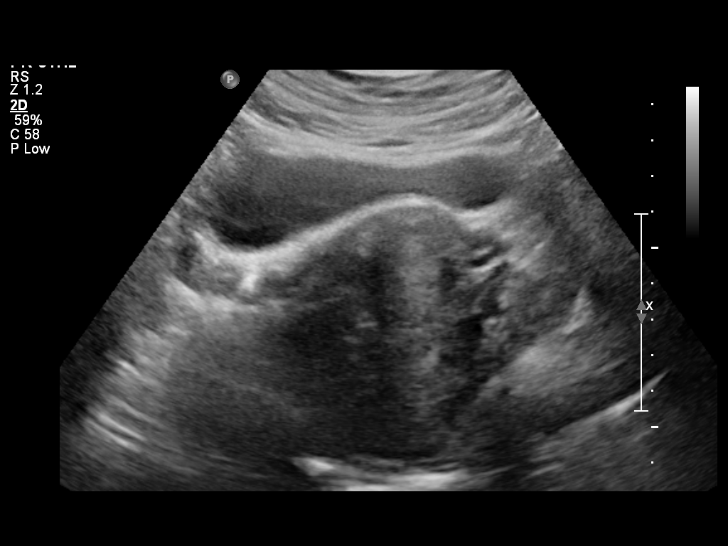
[im 12/61]
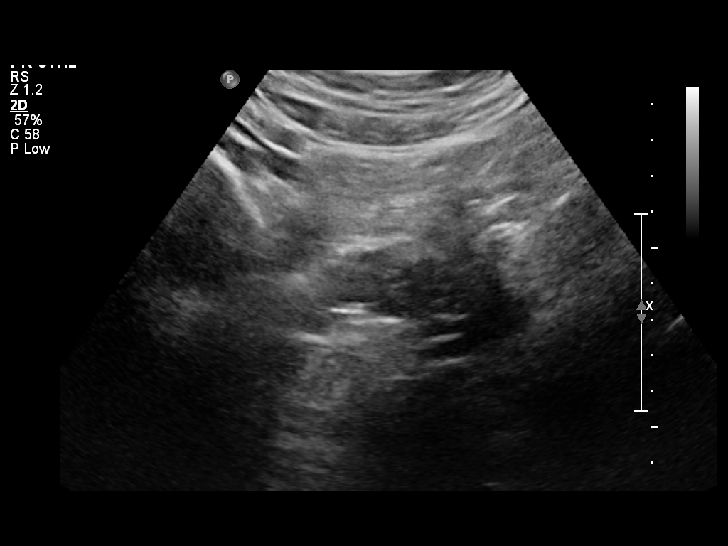
[im 16/61]
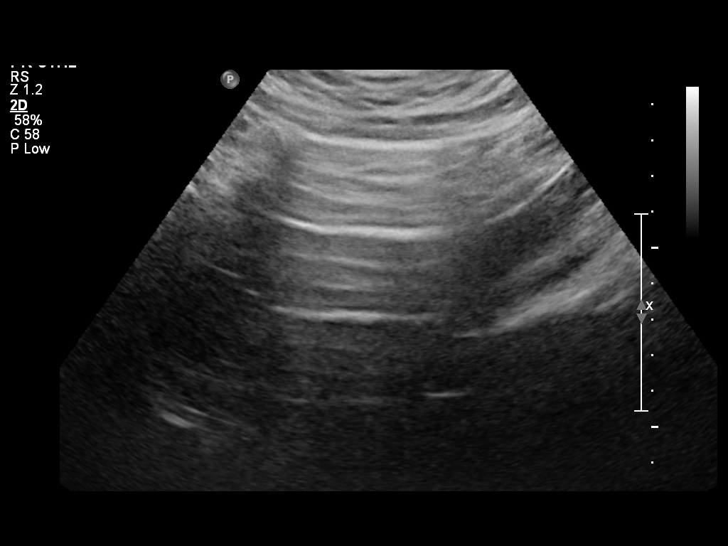
[im 21/61]
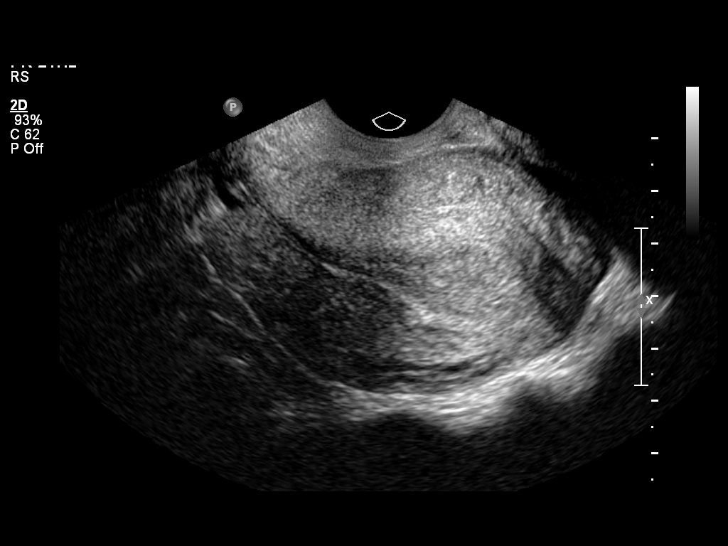
[im 25/61]
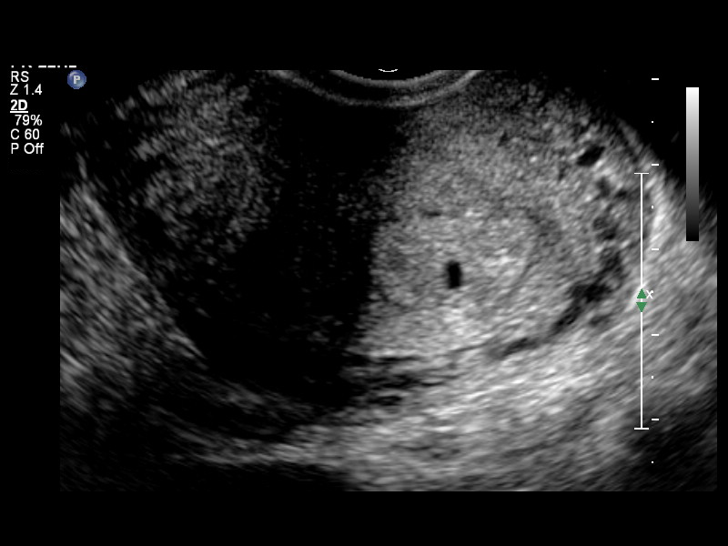
[im 29/61]
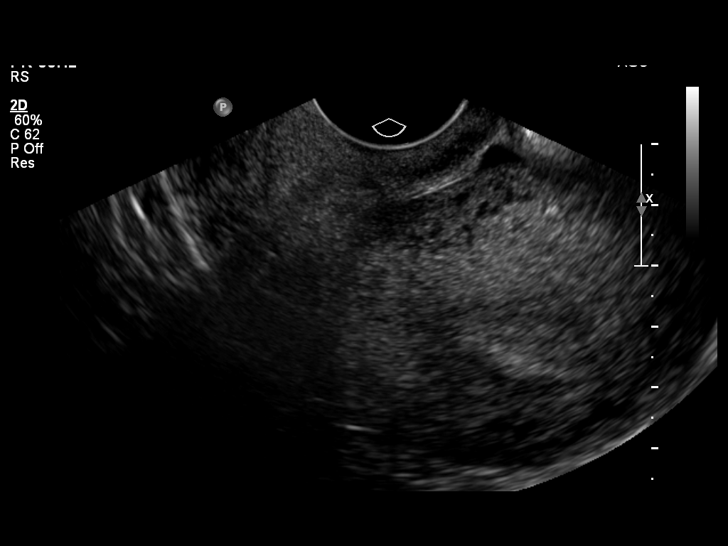
[im 34/61]
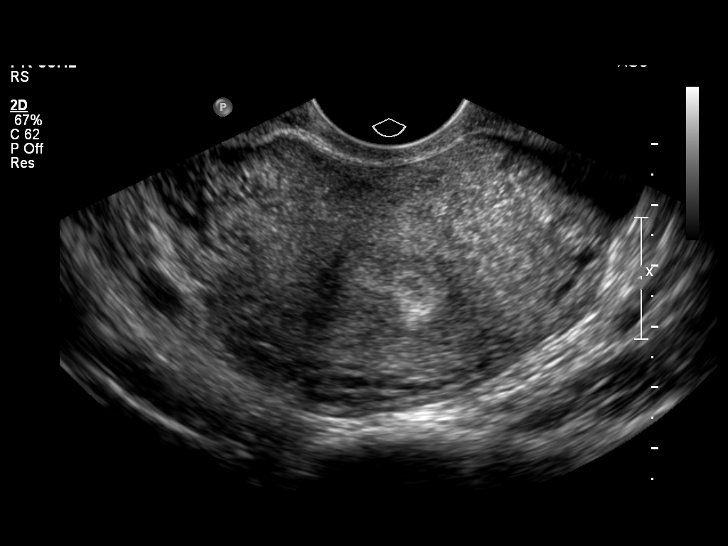
[im 38/61]
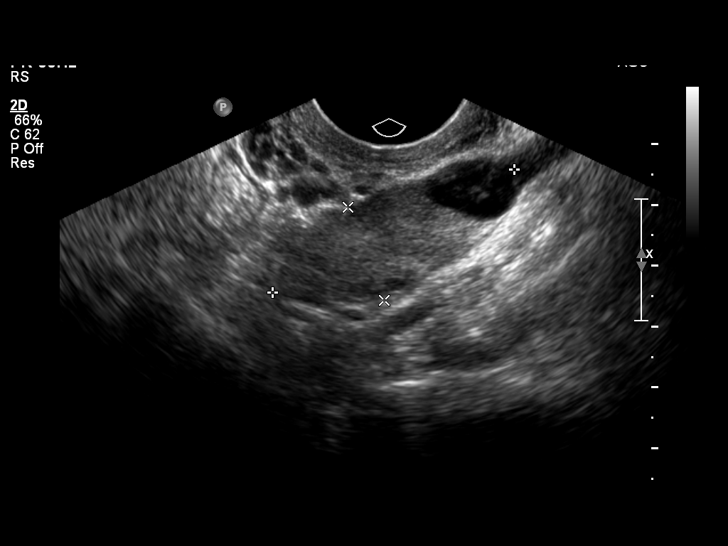
[im 43/61]
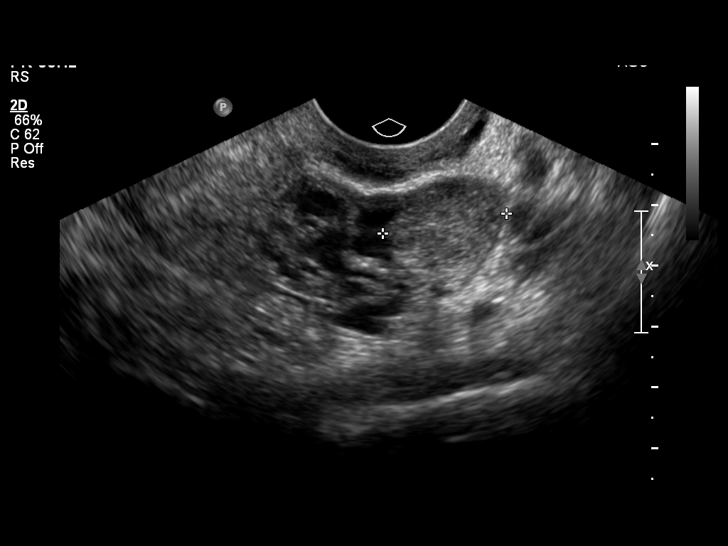
[im 47/61]
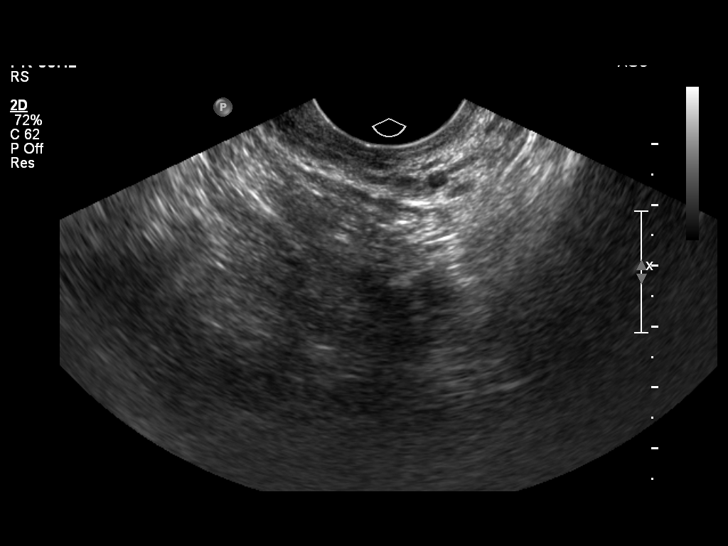
[im 52/61]
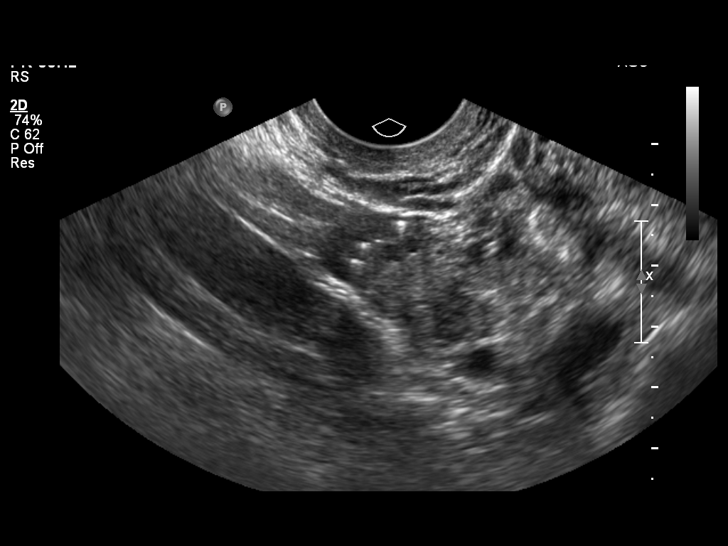
[im 56/61]
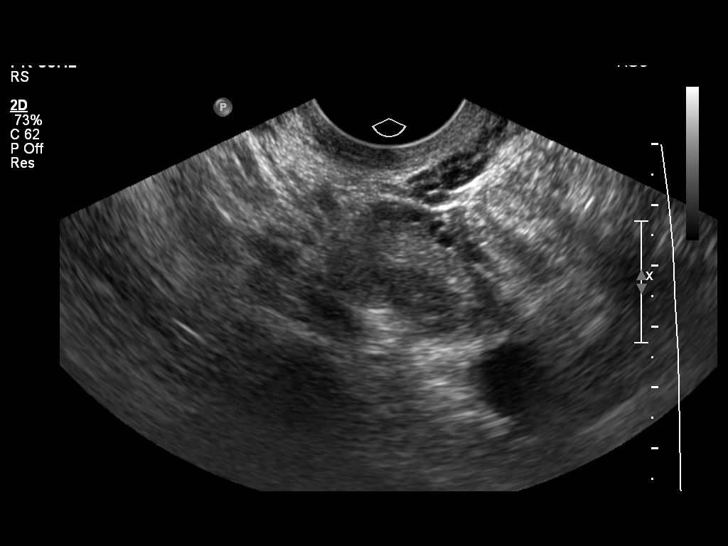
[im 61/61]
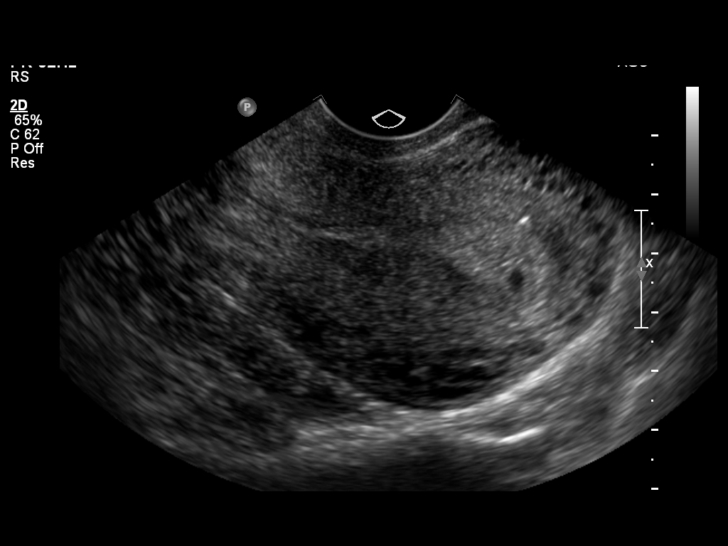

[14 of 28 positions shown; findings below may reference images not displayed]

IMPRESSION: See AS Obstetric US report.

## 2012-02-13 IMAGING — US US OB TRANSVAGINAL
1 series · 14 of 28 positions shown · non-contrast
Comparison: none

OBSTETRICAL ULTRASOUND:
 This ultrasound exam was performed in the [HOSPITAL] Ultrasound Department.  The OB US report was generated in the AS system, and faxed to the ordering physician.  This report is also available in [HOSPITAL]?s [employer] and in [REDACTED] PACS.

[Series 1: us ob transvaginal · 35 acquisitions, 14 frames shown]
[im 2/35]
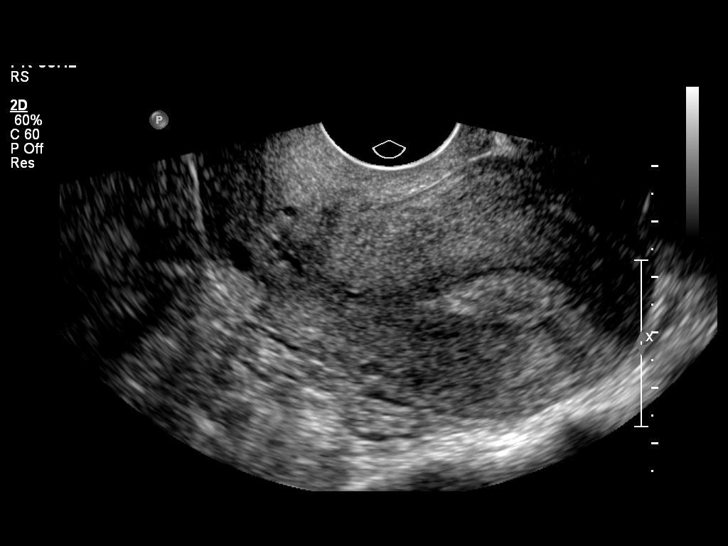
[im 4/35]
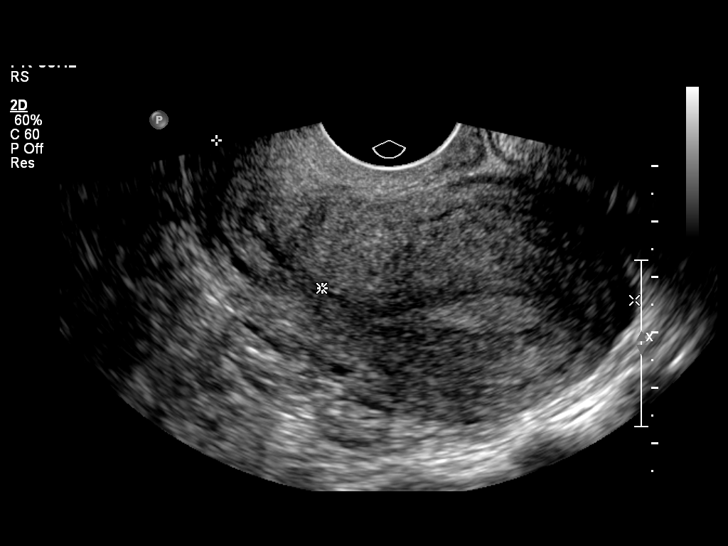
[im 7/35]
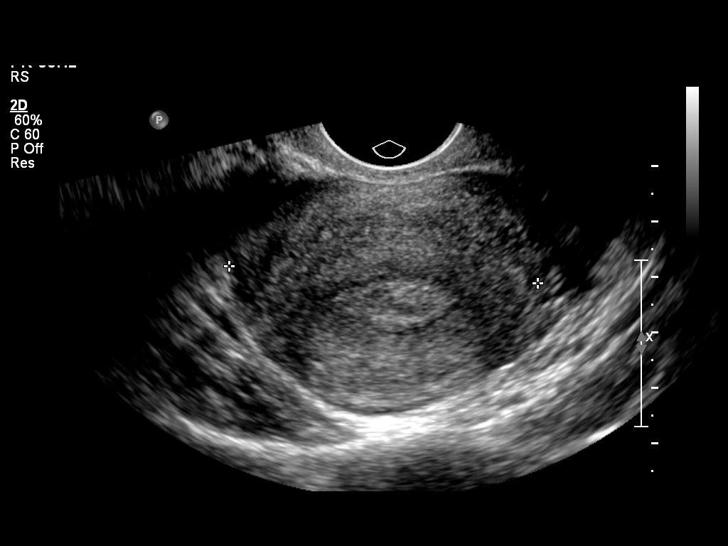
[im 9/35]
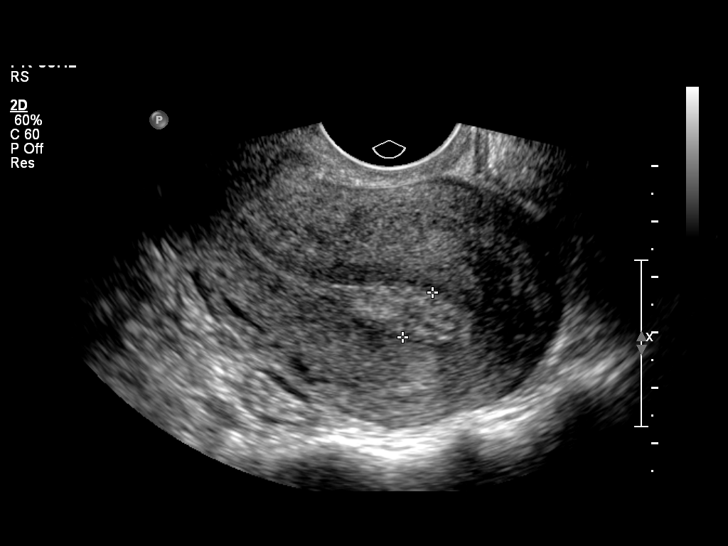
[im 12/35]
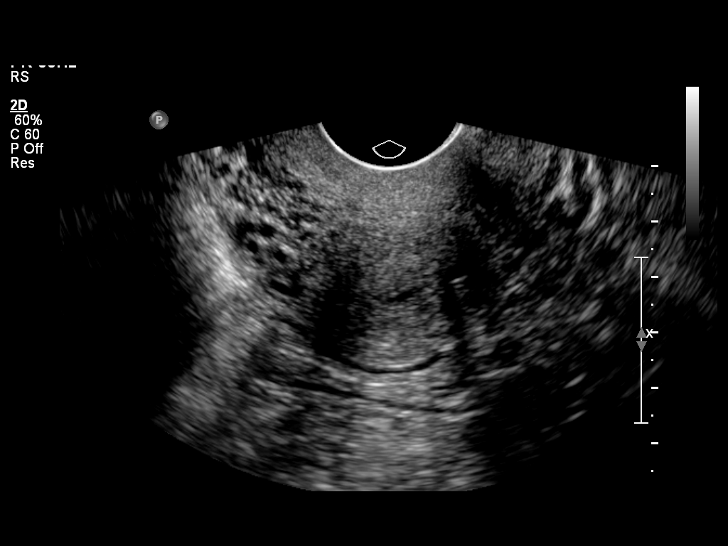
[im 14/35]
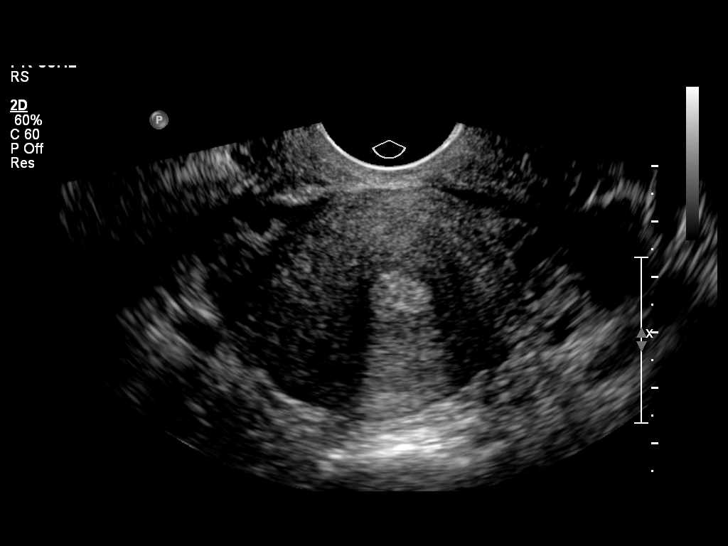
[im 17/35]
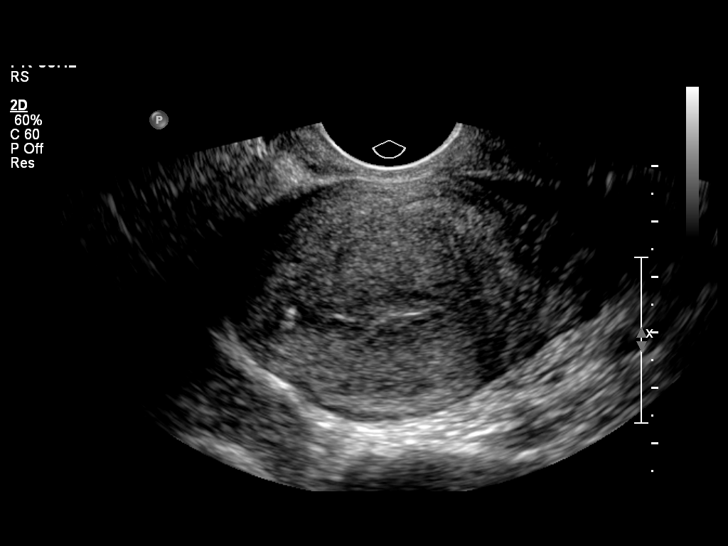
[im 19/35]
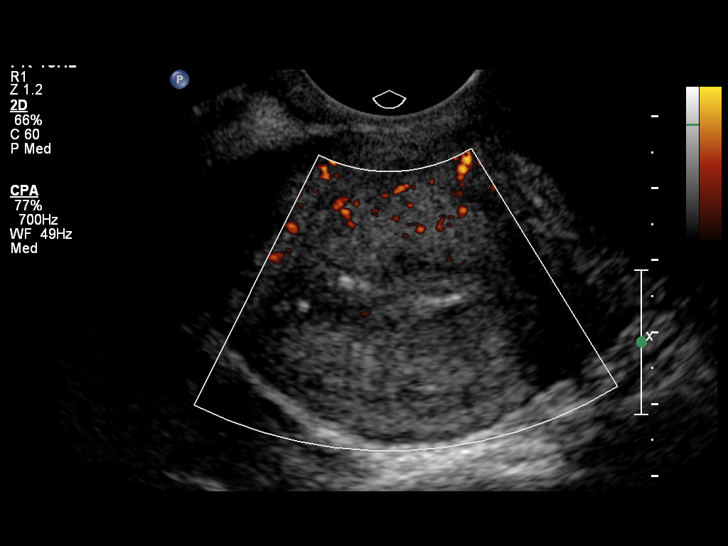
[im 22/35]
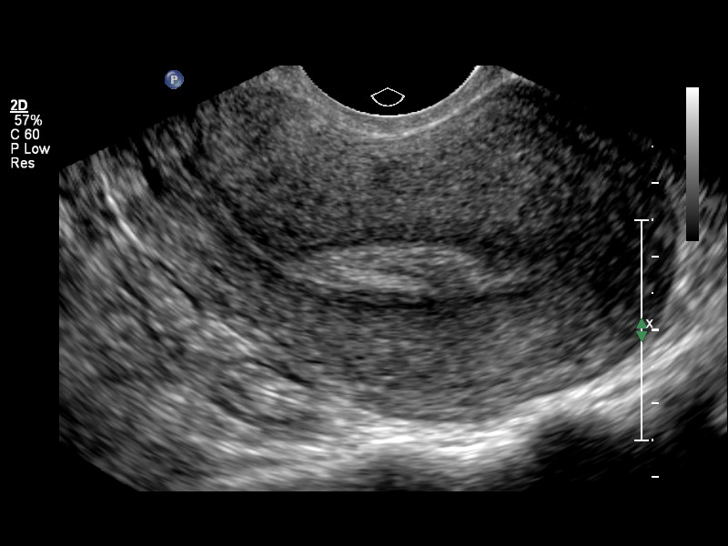
[im 24/35]
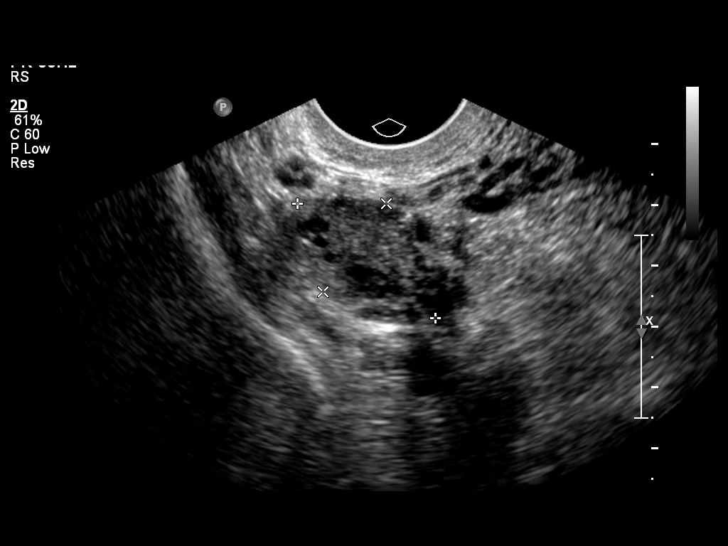
[im 27/35]
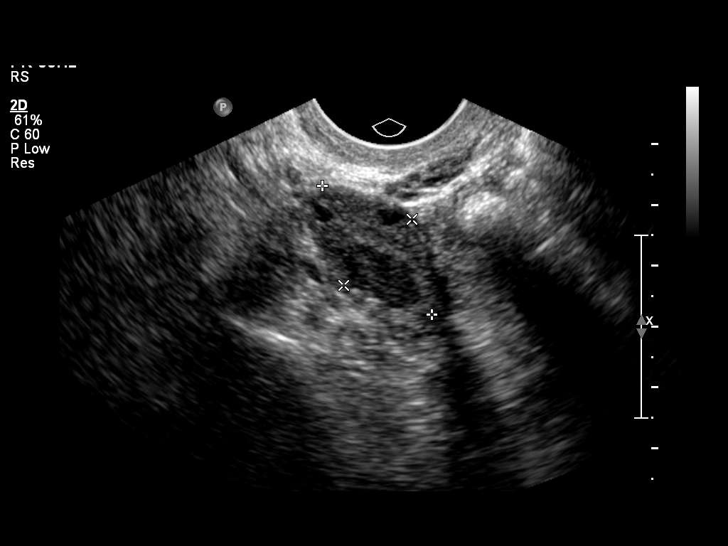
[im 29/35]
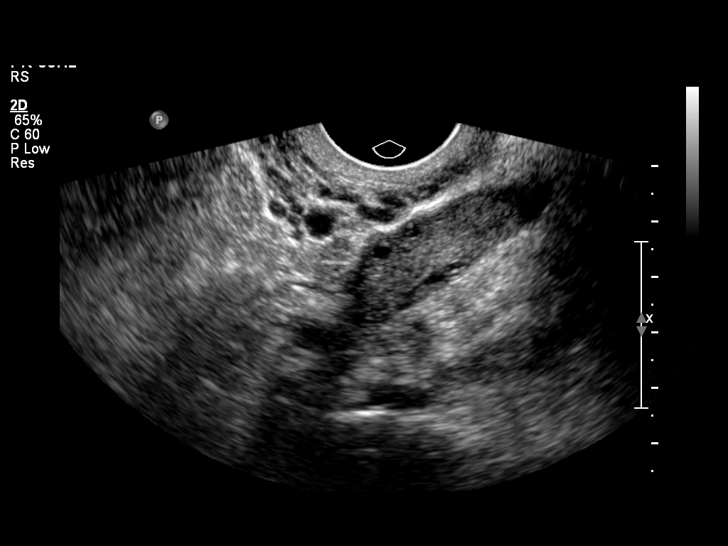
[im 32/35]
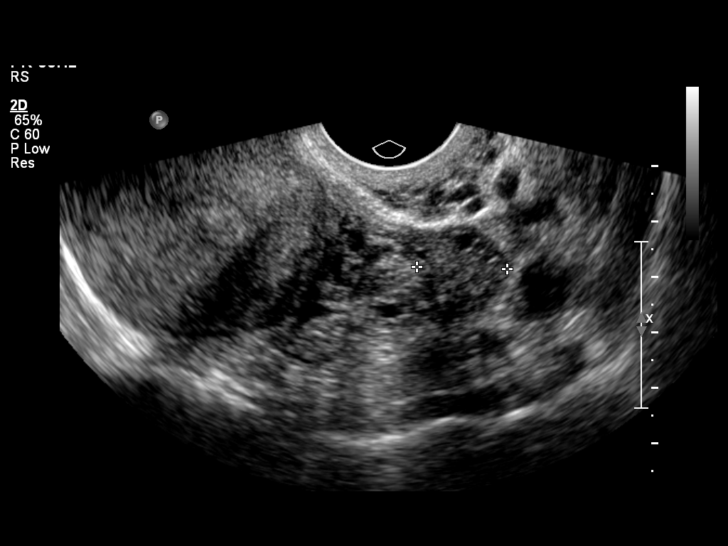
[im 35/35]
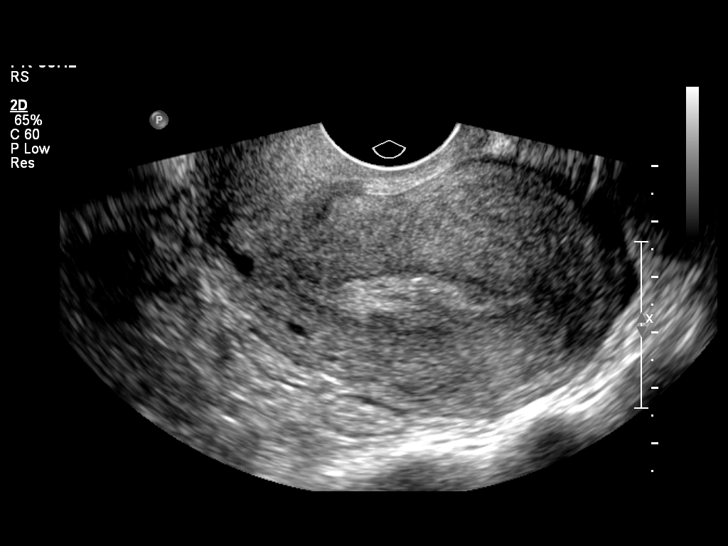

[14 of 28 positions shown; findings below may reference images not displayed]

IMPRESSION: See AS Obstetric US report.

## 2012-05-17 ENCOUNTER — Emergency Department (HOSPITAL_COMMUNITY)
Admission: EM | Admit: 2012-05-17 | Discharge: 2012-05-17 | Disposition: A | Discharge status: Home / Self Care | Payer: Medicaid Other | Attending: Emergency Medicine | Admitting: Emergency Medicine

## 2012-05-17 ENCOUNTER — Encounter (HOSPITAL_COMMUNITY): Payer: Self-pay | Admitting: Emergency Medicine

## 2012-05-17 DIAGNOSIS — Z862 Personal history of diseases of the blood and blood-forming organs and certain disorders involving the immune mechanism: Secondary | ICD-10-CM | POA: Insufficient documentation

## 2012-05-17 DIAGNOSIS — R21 Rash and other nonspecific skin eruption: Secondary | ICD-10-CM | POA: Insufficient documentation

## 2012-05-17 DIAGNOSIS — L259 Unspecified contact dermatitis, unspecified cause: Secondary | ICD-10-CM | POA: Insufficient documentation

## 2012-05-17 DIAGNOSIS — F17200 Nicotine dependence, unspecified, uncomplicated: Secondary | ICD-10-CM | POA: Insufficient documentation

## 2012-05-17 DIAGNOSIS — T4995XA Adverse effect of unspecified topical agent, initial encounter: Secondary | ICD-10-CM | POA: Insufficient documentation

## 2012-05-17 DIAGNOSIS — L299 Pruritus, unspecified: Secondary | ICD-10-CM | POA: Insufficient documentation

## 2012-05-17 MED ORDER — FAMOTIDINE 20 MG PO TABS: 20.0000 mg | ORAL_TABLET | Freq: Two times a day (BID) | ORAL | Status: DC

## 2012-05-17 MED ORDER — PREDNISONE 10 MG PO TABS: 20.0000 mg | ORAL_TABLET | Freq: Every day | ORAL | Status: DC

## 2012-05-17 NOTE — ED Notes (Addendum)
Pt reports itching and swelling on back of neck and face after using hair dye one week ago. L/ear swollen and red.Red raised "itching bumps" noted on back of neck

## 2012-05-17 NOTE — ED Provider Notes (Signed)
Medical screening examination/treatment/procedure(s) were performed by non-physician practitioner and as supervising physician I was immediately available for consultation/collaboration.  Kaysin Brock R. Lazer Wollard, MD 05/17/12 1545 

## 2012-05-17 NOTE — ED Provider Notes (Signed)
History     CSN: [id number]  Arrival date & time 05/17/12  1219   First MD Initiated Contact with Patient 05/17/12 1325      Chief Complaint  Patient presents with  . Rash    rash on face and neck after using hair dye  . Allergic Reaction    denies difficulty swallowing    (Consider location/radiation/quality/duration/timing/severity/associated sxs/prior treatment) HPI  26 year old female with history of eczema presents complaining of a rash. Patient reports she went to a beauty salon to have her hair straightening 1 week ago.  She noticed there were chemical on the counter.  She then noticed a rash to the back of her neck and also to her elbow which developed a day later.  Rash is itchy, burning, and has been persistent.  Onset is gradual, duration of nearly 1 week.  She took some leftover prednisone and benadryl with some relief.  She otherwise denies fever, headache, throat swelling, cp, sob, n/v/d.  She also report having a new dog x 3 weeks.  Denies any other environmental changes or medication changes.    Past Medical History  Diagnosis Date  . Anemia     in the past    Past Surgical History  Procedure Date  . No past surgeries   . Laparoscopic tubal ligation 03/29/2011    Procedure: LAPAROSCOPIC TUBAL LIGATION;  Surgeon: Roseanna Rainbow, MD;  Location: [facility];  Service: Gynecology;  Laterality: Bilateral;    Family History  Problem Relation Age of Onset  . Diabetes Mother   . Heart disease Mother   . Heart disease Father     History  Substance Use Topics  . Smoking status: Current Some Day Smoker -- 0.2 packs/day    Types: Cigarettes  . Smokeless tobacco: Never Used  . Alcohol Use: Yes    OB History    Grav Para Term Preterm Abortions TAB SAB Ect Mult Living   4 2 2  2 1 1   2       Review of Systems  Constitutional: Negative for fever.  Gastrointestinal: Negative for nausea and vomiting.  Musculoskeletal: Negative for arthralgias.  Skin:  Positive for rash.  Neurological: Negative for headaches.    Allergies  Aspirin; Doxycycline; Penicillins cross reactors; and Sulfa drugs cross reactors  Home Medications   Current Outpatient Rx  Name  Route  Sig  Dispense  Refill  . DIPHENHYDRAMINE HCL 25 MG PO CAPS   Oral   Take 25 mg by mouth every 6 (six) hours as needed.         Marland Kitchen NAPROXEN SODIUM 220 MG PO TABS   Oral   Take 220 mg by mouth 2 (two) times daily as needed. For pain           BP 125/59  Pulse 77  Temp 98.3 F (36.8 C) (Oral)  Resp 18  SpO2 100%  LMP 04/21/2012  Breastfeeding? No  Physical Exam  Nursing note and vitals reviewed. Constitutional: She is oriented to person, place, and time. She appears well-developed and well-nourished. No distress.  HENT:  Head: Normocephalic and atraumatic.  Mouth/Throat: Oropharynx is clear and moist. No oropharyngeal exudate.  Eyes: Conjunctivae normal are normal.  Neck: Normal range of motion. Neck supple.  Cardiovascular: Normal rate and regular rhythm.   Pulmonary/Chest: Effort normal and breath sounds normal. No respiratory distress. She has no wheezes.  Abdominal: Soft. There is no tenderness.  Musculoskeletal: She exhibits no edema and no tenderness.  Neurological: She is alert and oriented to person, place, and time.  Skin: Rash (fine maculopapular rash noted to posterior neck, posterior aspect of L ear, extensor aspect of bilateral elbow.  Non petechial, pustular, or vesicular) noted.    ED Course  Procedures (including critical care time)  Labs Reviewed - No data to display No results found.   No diagnosis found.  1. rash  MDM  Rash to back of neck, posterior L ear, and extensor aspect of both elbow.  No rash in palm of hands and soles of foot.  No mucosal involvement.  Rash can be worsening of eczema, or localized reaction.  No airway compromised.  Plan to give pt prednisone/pepcid/benadryl.  Will also give abx to prevent superimposed skin  infection.  Pt made aware to avoid chemical exposure as she has prior allergic rxn to chemical exposure (per pt report).    No plaque formation to suggest psoriasis.  No red flags.    BP 125/59  Pulse 77  Temp 98.3 F (36.8 C) (Oral)  Resp 18  SpO2 100%  LMP 04/21/2012  Breastfeeding? No       Fayrene Helper, PA-C 05/17/12 1339  Fayrene Helper, PA-C 05/17/12 1341

## 2012-09-03 ENCOUNTER — Ambulatory Visit: Payer: Self-pay | Admitting: Obstetrics

## 2013-06-24 ENCOUNTER — Encounter: Payer: Self-pay | Admitting: Obstetrics

## 2013-06-24 ENCOUNTER — Ambulatory Visit (INDEPENDENT_AMBULATORY_CARE_PROVIDER_SITE_OTHER): Payer: Medicaid Other | Admitting: Obstetrics

## 2013-06-24 VITALS — BP 122/80 | HR 96 | Temp 98.7°F | Ht 66.0 in | Wt 185.0 lb

## 2013-06-24 DIAGNOSIS — N760 Acute vaginitis: Secondary | ICD-10-CM

## 2013-06-24 DIAGNOSIS — Z0000 Encounter for general adult medical examination without abnormal findings: Secondary | ICD-10-CM

## 2013-06-24 LAB — CBC WITH DIFFERENTIAL/PLATELET
BASOS PCT: 1 % (ref 0–1)
Basophils Absolute: 0.1 10*3/uL (ref 0.0–0.1)
EOS ABS: 0.2 10*3/uL (ref 0.0–0.7)
EOS PCT: 3 % (ref 0–5)
HEMATOCRIT: 41.3 % (ref 36.0–46.0)
HEMOGLOBIN: 14.1 g/dL (ref 12.0–15.0)
Lymphocytes Relative: 38 % (ref 12–46)
Lymphs Abs: 2.2 10*3/uL (ref 0.7–4.0)
MCH: 32.7 pg (ref 26.0–34.0)
MCHC: 34.1 g/dL (ref 30.0–36.0)
MCV: 95.8 fL (ref 78.0–100.0)
MONO ABS: 0.4 10*3/uL (ref 0.1–1.0)
MONOS PCT: 6 % (ref 3–12)
NEUTROS PCT: 52 % (ref 43–77)
Neutro Abs: 3 10*3/uL (ref 1.7–7.7)
Platelets: 220 10*3/uL (ref 150–400)
RBC: 4.31 MIL/uL (ref 3.87–5.11)
RDW: 12.8 % (ref 11.5–15.5)
WBC: 5.7 10*3/uL (ref 4.0–10.5)

## 2013-06-24 LAB — POCT URINALYSIS DIPSTICK
Bilirubin, UA: NEGATIVE
Blood, UA: NEGATIVE
Glucose, UA: NEGATIVE
Ketones, UA: NEGATIVE
LEUKOCYTES UA: NEGATIVE
NITRITE UA: NEGATIVE
PH UA: 8
Spec Grav, UA: 1.01
Urobilinogen, UA: NEGATIVE

## 2013-06-24 NOTE — Progress Notes (Signed)
  Subjective:     Elizabeth BargesStephanie M Alvarez is a 28 y.o. female here for a routine exam.  Current complaints: None.  Personal health questionnaire reviewed: yes.   Gynecologic History Patient's last menstrual period was 06/08/2013. Contraception: Not asked Last Pap: 2014. Results were: normal Last mammogram: n/a. Results were: n/a  Obstetric History OB History  Gravida Para Term Preterm AB SAB TAB Ectopic Multiple Living  4 2 2  2 1 1   2     # Outcome Date GA Lbr Len/2nd Weight Sex Delivery Anes PTL Lv  4 TRM 01/24/11 1354w1d 23:30 / 00:46 7 lb 7 oz (3.374 kg) M SVD EPI  Y     Comments: none  3 TRM 04/11/01 4269w0d   F SVD None N Y  2 SAB           1 TAB                The following portions of the patient's history were reviewed and updated as appropriate: allergies, current medications, past family history, past medical history, past social history, past surgical history and problem list.  Review of Systems Pertinent items are noted in HPI.    Objective:    General appearance: alert and no distress Breasts: normal appearance, no masses or tenderness Abdomen: normal findings: soft, non-tender Pelvic: cervix normal in appearance, external genitalia normal, no adnexal masses or tenderness, no cervical motion tenderness, rectovaginal septum normal, uterus normal size, shape, and consistency and vagina normal without discharge    Assessment:    Healthy female exam.    Plan:   Education reviewed:  Safe sex and contraceptive importance.  Follow up in: 1 year.

## 2013-06-25 ENCOUNTER — Encounter: Payer: Self-pay | Admitting: Obstetrics

## 2013-06-25 LAB — PAP IG W/ RFLX HPV ASCU

## 2013-06-25 LAB — COMPREHENSIVE METABOLIC PANEL
ALT: 11 U/L (ref 0–35)
AST: 16 U/L (ref 0–37)
Albumin: 4.5 g/dL (ref 3.5–5.2)
Alkaline Phosphatase: 50 U/L (ref 39–117)
BUN: 16 mg/dL (ref 6–23)
CALCIUM: 9.5 mg/dL (ref 8.4–10.5)
CHLORIDE: 101 meq/L (ref 96–112)
CO2: 25 mEq/L (ref 19–32)
CREATININE: 0.84 mg/dL (ref 0.50–1.10)
Glucose, Bld: 73 mg/dL (ref 70–99)
POTASSIUM: 3.7 meq/L (ref 3.5–5.3)
Sodium: 135 mEq/L (ref 135–145)
Total Bilirubin: 0.3 mg/dL (ref 0.3–1.2)
Total Protein: 6.7 g/dL (ref 6.0–8.3)

## 2013-06-25 LAB — GC/CHLAMYDIA PROBE AMP
CT Probe RNA: NEGATIVE
GC Probe RNA: NEGATIVE

## 2013-06-25 LAB — WET PREP BY MOLECULAR PROBE
Candida species: NEGATIVE
GARDNERELLA VAGINALIS: POSITIVE — AB
Trichomonas vaginosis: NEGATIVE

## 2013-06-25 LAB — HEPATITIS C ANTIBODY: HCV AB: NEGATIVE

## 2013-06-25 LAB — HEPATITIS B SURFACE ANTIGEN: Hepatitis B Surface Ag: NEGATIVE

## 2013-06-25 LAB — RPR

## 2013-06-25 LAB — TSH: TSH: 0.947 u[IU]/mL (ref 0.350–4.500)

## 2013-06-25 LAB — HIV ANTIBODY (ROUTINE TESTING W REFLEX): HIV: NONREACTIVE

## 2013-07-12 ENCOUNTER — Encounter: Payer: Self-pay | Admitting: Obstetrics

## 2013-07-13 ENCOUNTER — Other Ambulatory Visit: Payer: Self-pay | Admitting: *Deleted

## 2013-07-13 DIAGNOSIS — N760 Acute vaginitis: Principal | ICD-10-CM

## 2013-07-13 DIAGNOSIS — B9689 Other specified bacterial agents as the cause of diseases classified elsewhere: Secondary | ICD-10-CM

## 2013-07-13 MED ORDER — METRONIDAZOLE 500 MG PO TABS: 500.0000 mg | ORAL_TABLET | Freq: Two times a day (BID) | ORAL | Status: DC

## 2013-07-21 ENCOUNTER — Encounter: Payer: Self-pay | Admitting: Obstetrics

## 2014-04-18 ENCOUNTER — Encounter: Payer: Self-pay | Admitting: Obstetrics

## 2014-05-10 ENCOUNTER — Ambulatory Visit: Payer: Medicaid Other | Admitting: Obstetrics

## 2015-05-08 ENCOUNTER — Encounter (HOSPITAL_COMMUNITY): Payer: Self-pay

## 2015-05-08 ENCOUNTER — Emergency Department (HOSPITAL_COMMUNITY)
Admission: EM | Admit: 2015-05-08 | Discharge: 2015-05-08 | Disposition: A | Discharge status: Home / Self Care | Payer: Medicaid Other | Attending: Physician Assistant | Admitting: Physician Assistant

## 2015-05-08 DIAGNOSIS — L239 Allergic contact dermatitis, unspecified cause: Secondary | ICD-10-CM

## 2015-05-08 DIAGNOSIS — Z862 Personal history of diseases of the blood and blood-forming organs and certain disorders involving the immune mechanism: Secondary | ICD-10-CM | POA: Insufficient documentation

## 2015-05-08 DIAGNOSIS — F17210 Nicotine dependence, cigarettes, uncomplicated: Secondary | ICD-10-CM | POA: Insufficient documentation

## 2015-05-08 DIAGNOSIS — L234 Allergic contact dermatitis due to dyes: Secondary | ICD-10-CM | POA: Insufficient documentation

## 2015-05-08 DIAGNOSIS — Z880 Allergy status to penicillin: Secondary | ICD-10-CM | POA: Insufficient documentation

## 2015-05-08 DIAGNOSIS — T494X5A Adverse effect of keratolytics, keratoplastics, and other hair treatment drugs and preparations, initial encounter: Secondary | ICD-10-CM | POA: Insufficient documentation

## 2015-05-08 MED ORDER — PREDNISONE 10 MG PO TABS: 20.0000 mg | ORAL_TABLET | Freq: Every day | ORAL | Status: DC

## 2015-05-08 MED ORDER — PREDNISONE 20 MG PO TABS
60.0000 mg | ORAL_TABLET | Freq: Once | ORAL | Status: AC
  Administered 2015-05-08: 60 mg via ORAL
  Filled 2015-05-08: qty 3

## 2015-05-08 NOTE — ED Notes (Signed)
Pt c/o allergic reaction to hair dye starting last night.  Pain score 6/10.  Redness, swelling, and rash noted to bilateral ears and neck.  Pt reports taking Benadryl w/o relief.  NAD noted.  No oral swelling noted.

## 2015-05-08 NOTE — ED Provider Notes (Signed)
CSN: [id number]     Arrival date & time 05/08/15  1048 History   First MD Initiated Contact with Patient 05/08/15 1144     Chief Complaint  Patient presents with  . Allergic Reaction     (Consider location/radiation/quality/duration/timing/severity/associated sxs/prior Treatment) HPI   Patient is a very pleasant 29 year old female presenting with allergic reaction to hair dye. Patient dyed it on Friday. She's been intermittently taking Benadryl without effect. She notes itching to her scalp with redness. Patient had this one time in the past and had Steroid taper in order to get her scalp better. Patient's washed several times with no resolution of symptoms.  Past Medical History  Diagnosis Date  . Anemia     in the past   Past Surgical History  Procedure Laterality Date  . No past surgeries    . Laparoscopic tubal ligation  03/29/2011    Procedure: LAPAROSCOPIC TUBAL LIGATION;  Surgeon: Roseanna RainbowLisa A Jackson-Moore, MD;  Location: [facility];  Service: Gynecology;  Laterality: Bilateral;  . Wisdom tooth extraction Bilateral     upper   Family History  Problem Relation Age of Onset  . Diabetes Mother   . Heart disease Mother   . Heart disease Father    Social History  Substance Use Topics  . Smoking status: Current Some Day Smoker -- 0.25 packs/day    Types: Cigarettes  . Smokeless tobacco: Never Used  . Alcohol Use: Yes     Comment: occasionally liquor   OB History    Gravida Para Term Preterm AB TAB SAB Ectopic Multiple Living   4 2 2  2 1 1   2      Review of Systems  Constitutional: Negative for activity change.  Respiratory: Negative for cough and shortness of breath.   Cardiovascular: Negative for chest pain.  Gastrointestinal: Negative for abdominal pain.      Allergies  Aspirin; Doxycycline; Penicillins cross reactors; and Sulfa drugs cross reactors  Home Medications   Prior to Admission medications   Medication Sig Start Date End Date Taking? Authorizing  Provider  diphenhydrAMINE (BENADRYL) 25 mg capsule Take 25 mg by mouth every 6 (six) hours as needed for itching or allergies.    Yes Historical Provider, MD  naproxen sodium (ANAPROX) 220 MG tablet Take 220 mg by mouth 2 (two) times daily as needed. For pain   Yes Historical Provider, MD   BP 131/82 mmHg  Pulse 74  Temp(Src) 98.3 F (36.8 C) (Oral)  Resp 14  SpO2 100%  LMP 04/24/2015 Physical Exam  Constitutional: She is oriented to person, place, and time. She appears well-developed and well-nourished.  HENT:  Head: Normocephalic and atraumatic.  Patient has erythema to the scalp or dye was applied. A couple areas extending beyond the hair follicles that show raised erythematous Uticacarea  Eyes: Right eye exhibits no discharge.  Cardiovascular: Normal rate, regular rhythm and normal heart sounds.   No murmur heard. Pulmonary/Chest: Effort normal and breath sounds normal. She has no wheezes. She has no rales.  Abdominal: Soft. She exhibits no distension. There is no tenderness.  Neurological: She is oriented to person, place, and time.  Skin: Skin is warm and dry. She is not diaphoretic.  Psychiatric: She has a normal mood and affect.  Nursing note and vitals reviewed.   ED Course  Procedures (including critical care time) Labs Review Labs Reviewed - No data to display  Imaging Review No results found. I have personally reviewed and evaluated these images and  lab results as part of my medical decision-making.   EKG Interpretation None      MDM   Final diagnoses:  None   patient presenting with allergic reaction to hair dye on Friday. Patient's symptoms have not gotten better that intervening 3-4 days. She's been intermittently taking Benadryl. However is been taking half a dose. Patient had to take steroids in the past for similar reaction.  Had a long discussion with patient about how she may have a bounce back reaction to steroids if she does not continue to take  them as prescribed. We will give her long dose of steroids however she is warned that if she stops taking it and the symptoms come back she may have to go on an even longer course.    Elan Brainerd Randall An, MD 05/08/15 1208

## 2015-05-08 NOTE — Discharge Instructions (Signed)
You will need to take take a long course of prednisone. This take Benadryl as well the first couple days. Please follow-up with your regular physician or to the physicians listed below if you have any changes in symptoms.   Emergency Department Resource Guide 1) Find a Doctor and Pay Out of Pocket Although you won't have to find out who is covered by your insurance plan, it is a good idea to ask around and get recommendations. You will then need to call the office and see if the doctor you have chosen will accept you as a new patient and what types of options they offer for patients who are self-pay. Some doctors offer discounts or will set up payment plans for their patients who do not have insurance, but you will need to ask so you aren't surprised when you get to your appointment.  2) Contact Your Local Health Department Not all health departments have doctors that can see patients for sick visits, but many do, so it is worth a call to see if yours does. If you don't know where your local health department is, you can check in your phone book. The CDC also has a tool to help you locate your state's health department, and many state websites also have listings of all of their local health departments.  3) Find a Walk-in Clinic If your illness is not likely to be very severe or complicated, you may want to try a walk in clinic. These are popping up all over the country in pharmacies, drugstores, and shopping centers. They're usually staffed by nurse practitioners or physician assistants that have been trained to treat common illnesses and complaints. They're usually fairly quick and inexpensive. However, if you have serious medical issues or chronic medical problems, these are probably not your best option.  No Primary Care Doctor: - Call Health Connect at  [phone] - they can help you locate a primary care doctor that  accepts your insurance, provides certain services, etc. - Physician Referral  Service- [phone]  Chronic Pain Problems: Organization         Address  Phone   Notes  [facility]  [phone] Patients need to be referred by their primary care doctor.   Medication Assistance: Organization         Address  Phone   Notes  [facility] [address] [place], [place] [address] [phone] --Must be a resident of [facility] -- Must have NO insurance coverage whatsoever (no Medicaid/ Medicare, etc.) -- The pt. MUST have a primary care doctor that directs their care regularly and follows them in the community   Med[facility]ist  [phone]   [employer]  [phone]) [phone]    Agencies that provide inexpensive medical care: Organization         Address  Phone   Notes  [facility]  [phone]   [facility]    [phone]   [facility] [address] [place], [place] [address] [phone]   [facility] [address], [place] [phone]   [facility]    [phone]   [facility]    [phone]   [facility]  [address], [place] Phone:  [phone], Fax:  [phone] Hours of Operation:  9 am - 6 pm, M-F.  Also accepts Medicaid/Medicare and self-pay.  [facility] for [place], [address], [place] Phone: [phone], Fax: [phone]. Hours of Operation:  8:30 am - 5:30 pm, M-F.  Also accepts Medicaid and self-pay.  [facility] [place] [address] [place] Phone: [phone]   [place], [place], [place] [phone], Ext. [phone] Mondays & Thursdays: 7-9 AM.  First 15 patients are seen on a first come, first serve basis.    [place] Providers:  Organization         Address  Phone   Notes  [facility] [address],  [phone] Also accepts self-pay patients.  [facility] [address], [place]  [phone]   [place], [address], [place] [phone]   [facility] [address] [place] [phone]   Lucianne Lei [address], [place]   [phone] Only accepts [place] Access [place] patients after they have their name applied to their card.   Self-Pay (no insurance) in [facility]:  Organization         Address  Phone   Notes  Sickle Cell Patients, [facility] [place] [phone]   [facility] [place] [phone]   [facility] [place]  [place], [place], [place] [phone]   [facility]/Dr. Osei-Bonsu  [address], [place] or [address], [place] [phone] Phone number for both [place] and [place] locations is the same.  [facility]gent [facility] and [facility] [address] [place] [phone]   [facility] [address], [place] or [address] [phone]   [facility] [address], [place] [phone], phone; [phone], fax Sees patients 1st and 3rd Saturday of every month.  Must not qualify for public or private insurance (i.e. Medicaid, Medicare, [place] Health Choice, Veterans' Benefits)  Household income should be no more than 200% of the poverty level The clinic cannot treat you if you are pregnant or think you are pregnant  Sexually transmitted diseases are not treated at the clinic.    Dental Care: Organization         Address  Phone  Notes  [facility] [place] [phone] Accepts children up to age 97 who are enrolled in  [place] or [place]; pregnant women with a Medicaid card; and children who have applied for Medicaid or [place] Health Choice, but were declined, whose parents can pay a reduced fee at time of service.  [facility]  [address], [place] [phone] Accepts children up to age 19 who are enrolled in [place] or [place]; pregnant women with a Medicaid card; and children who have applied for Medicaid or [place] Health Choice, but were declined, whose parents can pay a reduced fee at time of service.  [facility] Adult [facility] Access PROGRAM  [place] [phone] Patients are seen by appointment only. Walk-ins are not accepted. [place] will see patients 69 years of age and older. Monday - Tuesday (8am-5pm) Most Wednesdays (8:30-5pm) $30 per  visit, cash only  [facility] Adult [employer] PROGRAM  [address], [address] [phone] Patients are seen by appointment only. Walk-ins are not accepted. [place] will see patients 48 years of age and older. One Wednesday Evening (Monthly: Volunteer Based).  $30 per visit, cash only  [place]  [phone] for adults; Children under age 62, call Graduate Pediatric Dentistry at [phone]. Children aged 41-14, please call [phone] to request a pediatric application.  Dental services are provided in all areas of dental care including fillings, crowns and bridges, complete and partial dentures, implants, gum treatment, root canals, and extractions. Preventive care is also provided. Treatment is provided to both adults and children. Patients are selected via a lottery and there is often a waiting list.   [facility] [address] [place]  [phone] [email or web address]   [facility] [address] [place], [place] [phone], Ext. [phone] Second and Fourth Thursday of each month, opens at 6:30  AM; Clinic ends at 9 AM.  Patients are seen on a first-come first-served basis, and a limited number are seen during each clinic.   [facility]  [address] [place] Danker [place], [place] [phone]   Eligibility Requirements You must have lived in [place], [place], or [place] counties for at least the last three months.   You cannot be eligible for state or federal sponsored [employer], including [employer], [place], or [employer].   You generally cannot be eligible for healthcare insurance through your employer.    How to apply: Eligibility screenings are held every Tuesday and Wednesday afternoon from 1:00 pm until 4:00 pm. You do not need an appointment for the interview!  [facility] [address], [place], [place]   [place]  [place] Department  [place]  [phone]    Behavioral Health Resources in the Community: Intensive Outpatient Programs Organization         Address  Phone  Notes  [facility] [place]. [address] [place], [place] [phone]   [facility] Outpatient [address] [place], [place]   ADS: Alcohol & Drug Svcs [address] [place], [place]   [place] [address]  [place], [place] or [phone]   Substance Abuse Resources Organization         Address  Phone  Notes  Alcohol and Drug Services  [phone]   [place]  [phone]   [facility] [place]   Chinita Pester  [phone]   Residential & Outpatient Substance Abuse Program  [phone]   Psychological Services Organization         Address  Phone  Notes  [facility] [place]  [place]  [phone]   [place] [address] [place] or  [phone]    Mobile Crisis Teams Organization         Address  Phone  Notes  [facility], Mobile Crisis Care Unit  [phone]   [facility]  [address] [place], [place]   [place] Levels [address], [place] [phone]    Self-Help/Support Groups Organization         Address  Phone  Notes  [facility] of [place] - variety of support groups  [place] Call for more information  Narcotics Anonymous (NA), [facility] [address], [employer] [place]  2 meetings at this location   Special educational needs teacher         Address  Phone  Notes  ASAP Residential Treatment [place],    [place]  [phone]   [facility]  [address] [place] [phone], [place], [place]   [place], [place] [phone] Admissions: 8am-3pm M-F  Incentives Substance [place] [address]    [place], [place] [phone]   [facility] [address] [place], [place], [place]   [facility] [address],  [place], [place]   [facility] - Intensive Outpatient [address] Krist[address], [place], [place]   [facility] ([place] [address].,  [place], [place] [phone] or [phone]   [facility] Services (RTS) [address] [place], [place] Accepts Medicaid  [facility] [place] [address],  [place] [phone] Substance Abuse/Addiction Treatment   [facility] Organization         Address  Phone  Notes  [facility]  [phone]   Domenic Schwab, PhD [address] Arlis Port[place], [place]   [phone] or [phone]   [place] and [place], [place] [phone]   [facility] [address],  [place], [place] [phone] Insurance/Medicaid/sponsorship through [facility] and Families [address], [address] [place]                                    [place], [place] [phone] [place] [address] [place], [place] [phone]    Dr. Adele Schilder  [phone]   [facility] Dept. 1) [address], [place] 2) [place] 3)  [place] [address], [place] [phone]  [phone]   [place] [phone] or [phone] (After Hours)

## 2015-11-26 ENCOUNTER — Encounter (HOSPITAL_COMMUNITY): Payer: Self-pay | Admitting: Emergency Medicine

## 2015-11-26 ENCOUNTER — Emergency Department (HOSPITAL_COMMUNITY)
Admission: EM | Admit: 2015-11-26 | Discharge: 2015-11-26 | Disposition: A | Discharge status: Home / Self Care | Payer: Medicaid Other | Attending: Emergency Medicine | Admitting: Emergency Medicine

## 2015-11-26 DIAGNOSIS — T7840XA Allergy, unspecified, initial encounter: Secondary | ICD-10-CM

## 2015-11-26 DIAGNOSIS — L234 Allergic contact dermatitis due to dyes: Secondary | ICD-10-CM | POA: Insufficient documentation

## 2015-11-26 DIAGNOSIS — F17210 Nicotine dependence, cigarettes, uncomplicated: Secondary | ICD-10-CM | POA: Insufficient documentation

## 2015-11-26 DIAGNOSIS — L239 Allergic contact dermatitis, unspecified cause: Secondary | ICD-10-CM

## 2015-11-26 DIAGNOSIS — Z79899 Other long term (current) drug therapy: Secondary | ICD-10-CM | POA: Insufficient documentation

## 2015-11-26 MED ORDER — PREDNISONE 20 MG PO TABS: 20.0000 mg | ORAL_TABLET | Freq: Every day | ORAL | Status: DC

## 2015-11-26 MED ORDER — RANITIDINE HCL 150 MG PO TABS: 150.0000 mg | ORAL_TABLET | Freq: Two times a day (BID) | ORAL | Status: DC

## 2015-11-26 MED ORDER — HYDROCORTISONE 2.5 % EX CREA: TOPICAL_CREAM | Freq: Two times a day (BID) | CUTANEOUS | Status: DC | PRN

## 2015-11-26 NOTE — ED Notes (Addendum)
Pt c/o itching, skin edema to external ears and posterior neck onset 2 weeks ago after getting hair dyed. Benadryl offered some relief but symptoms persist. Mild hives to lower face noted this morning. No SOB.

## 2015-11-26 NOTE — ED Provider Notes (Signed)
CSN: [id number]     Arrival date & time 11/26/15  1357 History  By signing my name below, I, Linna Darner, attest that this documentation has been prepared under the direction and in the presence of [address] [employer]. Electronically Signed: Linna Darner, Scribe. 11/26/2015. 2:23 PM.   Chief Complaint  Patient presents with  . Allergic Reaction    Patient is a 30 y.o. female presenting with allergic reaction. The history is provided by the patient and medical records. No language interpreter was used.  Allergic Reaction Presenting symptoms: itching and rash   Presenting symptoms: no difficulty breathing, no difficulty swallowing and no swelling   Severity:  Severe Prior episodes: allergies to cosmetics (hair dye) Context: cosmetics   Relieved by:  Antihistamines and steroids Exacerbated by: Armond Hang. Ineffective treatments:  None tried    HPI Comments: Elizabeth Alvarez is a 30 y.o. female who presents to the Emergency Department complaining of constant, worsening, itchy red rash to her bilateral ears and posterior neck after getting her hair dyed two weeks ago, worse over the last few days. Pt has taken Benadryl and Prednisone (from a previous prescription) for her symptoms; she states Prednisone provided significant relief. Pt notes that her symptoms worsened with Aleve which she was taking for a previous injury. She has had a prior reaction to hair dye. She denies tongue/lip swelling, drainage, warmth, fever, chills, CP, SOB, trouble swallowing, drooling, abdominal pain, nausea, vomiting, diarrhea, constipation, dysuria, hematuria, numbness/tingling, weakness, myalgias, arthralgias, or any other associated symptoms.  Past Medical History  Diagnosis Date  . Anemia     in the past   Past Surgical History  Procedure Laterality Date  . No past surgeries    . Laparoscopic tubal ligation  03/29/2011    Procedure: LAPAROSCOPIC TUBAL LIGATION;  Surgeon: Roseanna Rainbow, MD;   Location: [facility];  Service: Gynecology;  Laterality: Bilateral;  . Wisdom tooth extraction Bilateral     upper   Family History  Problem Relation Age of Onset  . Diabetes Mother   . Heart disease Mother   . Heart disease Father    Social History  Substance Use Topics  . Smoking status: Current Some Day Smoker -- 0.25 packs/day    Types: Cigarettes  . Smokeless tobacco: Never Used  . Alcohol Use: Yes     Comment: occasionally liquor   OB History    Gravida Para Term Preterm AB TAB SAB Ectopic Multiple Living   4 2 2  2 1 1   2      Review of Systems  Constitutional: Negative for fever and chills.  HENT: Negative for drooling, facial swelling and trouble swallowing.   Respiratory: Negative for shortness of breath.   Cardiovascular: Negative for chest pain.  Gastrointestinal: Negative for nausea, vomiting, abdominal pain, diarrhea and constipation.  Genitourinary: Negative for dysuria and hematuria.  Musculoskeletal: Negative for myalgias and arthralgias.  Skin: Positive for itching and rash.  Allergic/Immunologic: Negative for immunocompromised state.  Neurological: Negative for weakness and numbness.  Psychiatric/Behavioral: Negative for confusion.   10 Systems reviewed and all are negative for acute change except as noted in the HPI.  Allergies  Aspirin; Doxycycline; Penicillins cross reactors; and Sulfa drugs cross reactors  Home Medications   Prior to Admission medications   Medication Sig Start Date End Date Taking? Authorizing Provider  diphenhydrAMINE (BENADRYL) 25 mg capsule Take 25 mg by mouth every 6 (six) hours as needed for itching or allergies.     Historical  Provider, MD  naproxen sodium (ANAPROX) 220 MG tablet Take 220 mg by mouth 2 (two) times daily as needed. For pain    Historical Provider, MD  predniSONE (DELTASONE) 10 MG tablet Take 2 tablets (20 mg total) by mouth daily. 05/08/15   Courteney Lyn Mackuen, MD   BP 131/84 mmHg  Pulse 86  Temp(Src)  98.4 F (36.9 C) (Oral)  Resp 16  SpO2 99% Physical Exam  Constitutional: She is oriented to person, place, and time. Vital signs are normal. She appears well-developed and well-nourished.  Non-toxic appearance. No distress.  Afebrile, nontoxic, NAD  HENT:  Head: Normocephalic and atraumatic.  Mouth/Throat: Mucous membranes are normal.  No face, lip, or tongue swelling.  Eyes: Conjunctivae and EOM are normal. Right eye exhibits no discharge. Left eye exhibits no discharge.  Neck: Normal range of motion. Neck supple.  Cardiovascular: Normal rate.   Pulmonary/Chest: Effort normal. No respiratory distress.  Abdominal: Normal appearance. She exhibits no distension.  Musculoskeletal: Normal range of motion.  Neurological: She is alert and oriented to person, place, and time. She has normal strength. No sensory deficit.  Skin: Skin is warm, dry and intact. Rash noted. Rash is urticarial.  Posterior neck and bilateral ears with an urticarial rash with some maculopapular patches, mildly erythematous without warmth or drainage, no evidence of cellulitis or abscesses.  Psychiatric: She has a normal mood and affect. Her behavior is normal.  Nursing note and vitals reviewed.   ED Course  Procedures (including critical care time)  DIAGNOSTIC STUDIES: Oxygen Saturation is 99% on RA, normal by my interpretation.    COORDINATION OF CARE: 2:23 PM Discussed treatment plan with pt at bedside and pt agreed to plan.  Labs Review Labs Reviewed - No data to display  Imaging Review No results found. I have personally reviewed and evaluated these images and lab results as part of my medical decision-making.   EKG Interpretation None      MDM   Final diagnoses:  Allergic dermatitis  Allergic reaction, initial encounter    30 y.o. female here with allergic reaction to hair dye x2wks. Rash to posterior neck and ears, no drainage or evidence of cellulitis. C/w contact dermatitis. Discussed  stopping the use of hair dyes since this is the second time she's had this. Will start on short course of prednisone since this helped in the past, in addition to benadryl/zantac/hydrocortisone cream. No evidence of angioedema or airway involvement. F/up with PCP in 1wk. I explained the diagnosis and have given explicit precautions to return to the ER including for any other new or worsening symptoms. The patient understands and accepts the medical plan as it's been dictated and I have answered their questions. Discharge instructions concerning home care and prescriptions have been given. The patient is STABLE and is discharged to home in good condition.   I personally performed the services described in this documentation, which was scribed in my presence. The recorded information has been reviewed and is accurate.  BP 131/84 mmHg  Pulse 86  Temp(Src) 98.4 F (36.9 C) (Oral)  Resp 16  SpO2 99%  Meds ordered this encounter  Medications  . predniSONE (DELTASONE) 20 MG tablet    Sig: Take 1 tablet (20 mg total) by mouth daily with breakfast. 1 tabs po daily x 7 days    Dispense:  7 tablet    Refill:  0    Order Specific Question:  Supervising Provider    Answer:  MILLER, BRIAN [[id number]]  .  hydrocortisone 2.5 % cream    Sig: Apply topically 2 (two) times daily as needed (itching).    Dispense:  15 g    Refill:  0    Order Specific Question:  Supervising Provider    Answer:  MILLER, BRIAN [[id number]]  . ranitidine (ZANTAC) 150 MG tablet    Sig: Take 1 tablet (150 mg total) by mouth 2 (two) times daily. x7 days    Dispense:  14 tablet    Refill:  0    Order Specific Question:  Supervising Provider    Answer:  Eber HongMILLER, BRIAN [[id number]]     Kristeen Lantz Camprubi-Soms, PA-C 11/26/15 1437  Loren Raceravid Yelverton, MD 11/26/15 2320

## 2015-11-26 NOTE — Discharge Instructions (Signed)
Take Prednisone, Benadryl, Zantac, and hydrocortisone cream as prescribed. Continue your usual home medications. Get plenty of rest and drink plenty of fluids. Avoid any known triggers. Please followup with your primary doctor for discussion of your diagnoses and further evaluation after today's visit. Return to the ER for changes or worsening symptoms

## 2015-11-26 NOTE — ED Notes (Signed)
Patient was alert, oriented and stable upon discharge. RN went over AVS and patient had no further questions.  

## 2016-03-25 ENCOUNTER — Ambulatory Visit: Payer: Medicaid Other | Admitting: Certified Nurse Midwife

## 2016-03-27 ENCOUNTER — Ambulatory Visit: Payer: Self-pay | Admitting: Certified Nurse Midwife

## 2016-03-27 ENCOUNTER — Ambulatory Visit: Payer: Medicaid Other | Admitting: Certified Nurse Midwife

## 2016-04-01 ENCOUNTER — Ambulatory Visit: Payer: Self-pay | Admitting: Obstetrics

## 2016-04-10 ENCOUNTER — Ambulatory Visit: Payer: Self-pay | Admitting: Obstetrics

## 2016-06-06 ENCOUNTER — Ambulatory Visit: Payer: Self-pay | Admitting: Obstetrics

## 2016-07-20 ENCOUNTER — Encounter (HOSPITAL_COMMUNITY): Payer: Self-pay | Admitting: Emergency Medicine

## 2016-07-20 ENCOUNTER — Emergency Department (HOSPITAL_COMMUNITY)
Admission: EM | Admit: 2016-07-20 | Discharge: 2016-07-20 | Disposition: A | Discharge status: Home / Self Care | Payer: Medicaid Other | Attending: Emergency Medicine | Admitting: Emergency Medicine

## 2016-07-20 DIAGNOSIS — R21 Rash and other nonspecific skin eruption: Secondary | ICD-10-CM | POA: Diagnosis present

## 2016-07-20 DIAGNOSIS — F17210 Nicotine dependence, cigarettes, uncomplicated: Secondary | ICD-10-CM | POA: Insufficient documentation

## 2016-07-20 DIAGNOSIS — T7840XA Allergy, unspecified, initial encounter: Secondary | ICD-10-CM

## 2016-07-20 DIAGNOSIS — Z79899 Other long term (current) drug therapy: Secondary | ICD-10-CM | POA: Insufficient documentation

## 2016-07-20 MED ORDER — DIPHENHYDRAMINE HCL 25 MG PO CAPS: 25.0000 mg | ORAL_CAPSULE | ORAL | 0 refills | Status: DC | PRN

## 2016-07-20 MED ORDER — PREDNISONE 10 MG PO TABS: ORAL_TABLET | ORAL | 0 refills | Status: DC

## 2016-07-20 NOTE — ED Triage Notes (Signed)
Pt reports using a box hair color yesterday and began having itching, swelling, and redness to scalp and head.  Airway intact.

## 2016-07-20 NOTE — ED Provider Notes (Signed)
[facility]PT Provider Note   CSN: [id number] Arrival date & time: 07/20/16  0749     History   Chief Complaint Chief Complaint  Patient presents with  . Allergic Reaction    HPI Elizabeth Alvarez is a 31 y.o. female.  The history is provided by the patient. No language interpreter was used.  Allergic Reaction  Presenting symptoms: itching and rash   Severity:  Moderate Duration:  1 day Prior allergic episodes:  No prior episodes Context: chemicals   Relieved by:  Nothing Worsened by:  Nothing Ineffective treatments:  None tried  Pt used a new hair color that blistered her scalp Past Medical History:  Diagnosis Date  . Anemia    in the past    Patient Active Problem List   Diagnosis Date Noted  . Contraceptive management 03/29/2011  . Pregnancy, supervision of normal 01/23/2011  . SROM (spontaneous rupture of membranes) 01/23/2011    Past Surgical History:  Procedure Laterality Date  . LAPAROSCOPIC TUBAL LIGATION  03/29/2011   Procedure: LAPAROSCOPIC TUBAL LIGATION;  Surgeon: Roseanna RainbowLisa A Jackson-Moore, MD;  Location: [facility];  Service: Gynecology;  Laterality: Bilateral;  . NO PAST SURGERIES    . WISDOM TOOTH EXTRACTION Bilateral    upper    OB History    Gravida Para Term Preterm AB Living   4 2 2   2 2    SAB TAB Ectopic Multiple Live Births   1 1     2        Home Medications    Prior to Admission medications   Medication Sig Start Date End Date Taking? Authorizing Provider  diphenhydrAMINE (BENADRYL) 25 mg capsule Take 25 mg by mouth every 6 (six) hours as needed for itching or allergies.     Historical Provider, MD  hydrocortisone 2.5 % cream Apply topically 2 (two) times daily as needed (itching). 11/26/15   Mercedes Street, PA-C  naproxen sodium (ANAPROX) 220 MG tablet Take 220 mg by mouth 2 (two) times daily as needed. For pain    Historical Provider, MD  predniSONE (DELTASONE) 10 MG tablet Take 2 tablets (20 mg total) by mouth daily.  05/08/15   Courteney Lyn Mackuen, MD  predniSONE (DELTASONE) 20 MG tablet Take 1 tablet (20 mg total) by mouth daily with breakfast. 1 tabs po daily x 7 days 11/26/15   Mercedes Street, PA-C  ranitidine (ZANTAC) 150 MG tablet Take 1 tablet (150 mg total) by mouth 2 (two) times daily. x7 days 11/26/15   Rhona RaiderMercedes Street, PA-C    Family History Family History  Problem Relation Age of Onset  . Diabetes Mother   . Heart disease Mother   . Heart disease Father     Social History Social History  Substance Use Topics  . Smoking status: Current Some Day Smoker    Packs/day: 0.25    Types: Cigarettes  . Smokeless tobacco: Never Used  . Alcohol use Yes     Comment: occasionally liquor     Allergies   Aspirin; Doxycycline; Penicillins cross reactors; and Sulfa drugs cross reactors   Review of Systems Review of Systems  Skin: Positive for itching and rash.  All other systems reviewed and are negative.    Physical Exam Updated Vital Signs BP 115/62 (BP Location: Left Arm)   Pulse 100   Temp 97.5 F (36.4 C) (Oral)   Resp 18   Ht 5\' 8"  (1.727 m)   Wt 88.5 kg   LMP 07/17/2016 (Exact Date)  SpO2 100%   BMI 29.65 kg/m   Physical Exam  Constitutional: She is oriented to person, place, and time. She appears well-developed and well-nourished.  HENT:  Head: Normocephalic.  Eyes: EOM are normal.  Neck: Normal range of motion.  Pulmonary/Chest: Effort normal.  Abdominal: She exhibits no distension.  Musculoskeletal: Normal range of motion.  Neurological: She is alert and oriented to person, place, and time.  Skin: There is erythema.  Red raised rash scalp,    Psychiatric: She has a normal mood and affect.  Nursing note and vitals reviewed.    ED Treatments / Results  Labs (all labs ordered are listed, but only abnormal results are displayed) Labs Reviewed - No data to display  EKG  EKG Interpretation None       Radiology No results  found.  Procedures Procedures (including critical care time)  Medications Ordered in ED Medications - No data to display   Initial Impression / Assessment and Plan / ED Course  I have reviewed the triage vital signs and the nursing notes.  Pertinent labs & imaging results that were available during my care of the patient were reviewed by me and considered in my medical decision making (see chart for details).     Pt advised to wash as much hair color out as possible, Prednisone taper, Benadry evvery 4 hours.   Final Clinical Impressions(s) / ED Diagnoses   Final diagnoses:  Allergic reaction, initial encounter    New Prescriptions New Prescriptions   PREDNISONE (DELTASONE) 10 MG TABLET    6,5,4,3,2,1  An After Visit Summary was printed and given to the patient.   Lonia Skinner [place], PA-C 07/20/16 4098    Donnetta Hutching, MD 07/21/16 [phone]

## 2016-07-20 NOTE — Discharge Instructions (Signed)
Return if any problems.

## 2016-11-22 ENCOUNTER — Other Ambulatory Visit (HOSPITAL_COMMUNITY)
Admission: RE | Admit: 2016-11-22 | Discharge: 2016-11-22 | Disposition: A | Discharge status: Home / Self Care | Payer: Medicaid Other | Source: Ambulatory Visit | Attending: Obstetrics | Admitting: Obstetrics

## 2016-11-22 ENCOUNTER — Encounter: Payer: Self-pay | Admitting: Obstetrics

## 2016-11-22 ENCOUNTER — Ambulatory Visit (INDEPENDENT_AMBULATORY_CARE_PROVIDER_SITE_OTHER): Payer: Medicaid Other | Admitting: Obstetrics

## 2016-11-22 VITALS — BP 108/71 | HR 73 | Ht 65.0 in

## 2016-11-22 DIAGNOSIS — N898 Other specified noninflammatory disorders of vagina: Secondary | ICD-10-CM

## 2016-11-22 DIAGNOSIS — Z113 Encounter for screening for infections with a predominantly sexual mode of transmission: Secondary | ICD-10-CM

## 2016-11-22 DIAGNOSIS — Z01419 Encounter for gynecological examination (general) (routine) without abnormal findings: Secondary | ICD-10-CM | POA: Diagnosis present

## 2016-11-22 DIAGNOSIS — N944 Primary dysmenorrhea: Secondary | ICD-10-CM

## 2016-11-22 DIAGNOSIS — Z308 Encounter for other contraceptive management: Secondary | ICD-10-CM

## 2016-11-22 DIAGNOSIS — J301 Allergic rhinitis due to pollen: Secondary | ICD-10-CM

## 2016-11-22 MED ORDER — PRENATAL 27-1 MG PO TABS
1.0000 | ORAL_TABLET | Freq: Every day | ORAL | 11 refills | Status: DC
Start: 2016-11-22 — End: 2017-05-03

## 2016-11-22 MED ORDER — LORATADINE 10 MG PO TABS: 10.0000 mg | ORAL_TABLET | Freq: Every day | ORAL | 11 refills | Status: DC

## 2016-11-22 MED ORDER — IBUPROFEN 800 MG PO TABS: 800.0000 mg | ORAL_TABLET | Freq: Three times a day (TID) | ORAL | 5 refills | Status: DC | PRN

## 2016-11-22 NOTE — Progress Notes (Signed)
Subjective:        Elizabeth Alvarez is a 31 y.o. female here for a routine exam.  Current complaints: Allergies.  Has had rapid weight loss.  Periods are normal, regular 2-3 day cycles.  Contraception:  BTL   Personal health questionnaire:  Is patient Ashkenazi Jewish, have a family history of breast and/or ovarian cancer: no Is there a family history of uterine cancer diagnosed at age < 14, gastrointestinal cancer, urinary tract cancer, family member who is a Personnel officer syndrome-associated carrier: no Is the patient overweight and hypertensive, family history of diabetes, personal history of gestational diabetes, preeclampsia or PCOS: no Is patient over 67, have PCOS,  family history of premature CHD under age 44, diabetes, smoke, have hypertension or peripheral artery disease:  no At any time, has a partner hit, kicked or otherwise hurt or frightened you?: no Over the past 2 weeks, have you felt down, depressed or hopeless?: no Over the past 2 weeks, have you felt little interest or pleasure in doing things?:no   Gynecologic History Patient's last menstrual period was 11/03/2016 (approximate). Contraception: tubal ligation Last Pap: 2015. Results were: normal Last mammogram: n/a. Results were: n/a  Obstetric History OB History  Gravida Para Term Preterm AB Living  4 2 2   2 2   SAB TAB Ectopic Multiple Live Births  1 1     2     # Outcome Date GA Lbr Len/2nd Weight Sex Delivery Anes PTL Lv  4 Term 01/24/11 [redacted]w[redacted]d 23:30 / 00:46 7 lb 7 oz (3.374 kg) M Vag-Spont EPI  LIV     Birth Comments: none  3 Term 04/11/01 [redacted]w[redacted]d   F Vag-Spont None N LIV  2 SAB           1 TAB               Past Medical History:  Diagnosis Date  . Anemia    in the past  . Eczema     Past Surgical History:  Procedure Laterality Date  . LAPAROSCOPIC TUBAL LIGATION  03/29/2011   Procedure: LAPAROSCOPIC TUBAL LIGATION;  Surgeon: Roseanna Rainbow, MD;  Location: [facility];  Service: Gynecology;   Laterality: Bilateral;  . NO PAST SURGERIES    . WISDOM TOOTH EXTRACTION Bilateral    upper     Current Outpatient Prescriptions:  .  diphenhydrAMINE (BENADRYL) 25 mg capsule, Take 1 capsule (25 mg total) by mouth every 4 (four) hours as needed for itching or allergies. (Patient not taking: Reported on 11/22/2016), Disp: 30 capsule, Rfl: 0 .  hydrocortisone 2.5 % cream, Apply topically 2 (two) times daily as needed (itching). (Patient not taking: Reported on 11/22/2016), Disp: 15 g, Rfl: 0 .  ibuprofen (ADVIL,MOTRIN) 800 MG tablet, Take 1 tablet (800 mg total) by mouth every 8 (eight) hours as needed., Disp: 30 tablet, Rfl: 5 .  loratadine (CLARITIN) 10 MG tablet, Take 1 tablet (10 mg total) by mouth daily., Disp: 30 tablet, Rfl: 11 .  naproxen sodium (ANAPROX) 220 MG tablet, Take 220 mg by mouth 2 (two) times daily as needed. For pain, Disp: , Rfl:  .  predniSONE (DELTASONE) 10 MG tablet, 6,5,4,3,2,1 (Patient not taking: Reported on 11/22/2016), Disp: 21 tablet, Rfl: 0 .  PRENATAL 27-1 MG TABS, Take 1 tablet by mouth daily before breakfast., Disp: 30 each, Rfl: 11 .  ranitidine (ZANTAC) 150 MG tablet, Take 1 tablet (150 mg total) by mouth 2 (two) times daily. x7 days (Patient not  taking: Reported on 11/22/2016), Disp: 14 tablet, Rfl: 0 Allergies  Allergen Reactions  . Aspirin Nausea And Vomiting  . Doxycycline Hives, Itching and Swelling  . Penicillins Cross Reactors Hives, Itching and Swelling    Has patient had a PCN reaction causing immediate rash, facial/tongue/throat swelling, SOB or lightheadedness with hypotension:yes Has patient had a PCN reaction causing severe rash involving mucus membranes or skin necrosis: no Has patient had a PCN reaction that required hospitalization- yes Has patient had a PCN reaction occurring within the last 10 years: yes, 7-8 yrs ago If all of the above answers are "NO", then may proceed with Cephalosporin use.   . Sulfa Drugs Cross Reactors Hives, Itching and  Swelling    Social History  Substance Use Topics  . Smoking status: Light Tobacco Smoker    Packs/day: 0.25    Types: Cigarettes  . Smokeless tobacco: Never Used  . Alcohol use Yes     Comment: occasionally liquor    Family History  Problem Relation Age of Onset  . Diabetes Mother   . Heart disease Mother   . Heart disease Father       Review of Systems  Constitutional: negative for fatigue and weight loss Respiratory: negative for cough and wheezing Cardiovascular: negative for chest pain, fatigue and palpitations Gastrointestinal: negative for abdominal pain and change in bowel habits Musculoskeletal:negative for myalgias Neurological: negative for gait problems and tremors Behavioral/Psych: negative for abusive relationship, depression Endocrine: negative for temperature intolerance    Genitourinary:negative for abnormal menstrual periods, genital lesions, hot flashes, sexual problems and vaginal discharge Integument/breast: negative for breast lump, breast tenderness, nipple discharge and skin lesion(s)    Objective:       BP 108/71   Pulse 73   Ht 5\' 5"  (1.651 m)   LMP 11/03/2016 (Approximate)  General:   alert  Skin:   no rash or abnormalities  Lungs:   clear to auscultation bilaterally  Heart:   regular rate and rhythm, S1, S2 normal, no murmur, click, rub or gallop  Breasts:   normal without suspicious masses, skin or nipple changes or axillary nodes  Abdomen:  normal findings: no organomegaly, soft, non-tender and no hernia  Pelvis:  External genitalia: normal general appearance Urinary system: urethral meatus normal and bladder without fullness, nontender Vaginal: normal without tenderness, induration or masses Cervix: normal appearance Adnexa: normal bimanual exam Uterus: anteverted and non-tender, normal size   Lab Review Urine pregnancy test Labs reviewed yes Radiologic studies reviewed no  50% of 20 min visit spent on counseling and coordination  of care.    Assessment:    Healthy female exam.    Plan:    Education reviewed: calcium supplements, depression evaluation, low fat, low cholesterol diet, safe sex/STD prevention, self breast exams, smoking cessation and weight bearing exercise. Contraception: tubal ligation. Follow up in: 1 year.   Meds ordered this encounter  Medications  . ibuprofen (ADVIL,MOTRIN) 800 MG tablet    Sig: Take 1 tablet (800 mg total) by mouth every 8 (eight) hours as needed.    Dispense:  30 tablet    Refill:  5  . PRENATAL 27-1 MG TABS    Sig: Take 1 tablet by mouth daily before breakfast.    Dispense:  30 each    Refill:  11  . loratadine (CLARITIN) 10 MG tablet    Sig: Take 1 tablet (10 mg total) by mouth daily.    Dispense:  30 tablet    Refill:  11   No orders of the defined types were placed in this encounter.    Patient ID: Elizabeth Alvarez, female   DOB: 14-Feb-1986, 31 y.o.   MRN: [id number]

## 2016-11-22 NOTE — Progress Notes (Signed)
Pt presents for Annual and c/o rapid weight loss. Pt requests hormone check.

## 2016-11-26 ENCOUNTER — Other Ambulatory Visit: Payer: Self-pay | Admitting: Obstetrics

## 2016-11-26 DIAGNOSIS — N760 Acute vaginitis: Principal | ICD-10-CM

## 2016-11-26 DIAGNOSIS — B9689 Other specified bacterial agents as the cause of diseases classified elsewhere: Secondary | ICD-10-CM

## 2016-11-26 LAB — CERVICOVAGINAL ANCILLARY ONLY
BACTERIAL VAGINITIS: POSITIVE — AB
CANDIDA VAGINITIS: NEGATIVE
CHLAMYDIA, DNA PROBE: NEGATIVE
NEISSERIA GONORRHEA: NEGATIVE
TRICH (WINDOWPATH): NEGATIVE

## 2016-11-26 MED ORDER — METRONIDAZOLE 500 MG PO TABS: 500.0000 mg | ORAL_TABLET | Freq: Two times a day (BID) | ORAL | 2 refills | Status: DC

## 2016-11-27 LAB — CYTOLOGY - PAP
Diagnosis: NEGATIVE
HPV (WINDOPATH): NOT DETECTED

## 2017-05-03 ENCOUNTER — Other Ambulatory Visit: Payer: Self-pay | Admitting: Obstetrics

## 2017-05-03 DIAGNOSIS — Z01419 Encounter for gynecological examination (general) (routine) without abnormal findings: Secondary | ICD-10-CM

## 2017-07-21 ENCOUNTER — Ambulatory Visit: Payer: Self-pay | Admitting: Obstetrics

## 2017-07-22 ENCOUNTER — Other Ambulatory Visit (HOSPITAL_COMMUNITY)
Admission: RE | Admit: 2017-07-22 | Discharge: 2017-07-22 | Disposition: A | Discharge status: Home / Self Care | Payer: Medicaid Other | Source: Ambulatory Visit | Attending: Obstetrics | Admitting: Obstetrics

## 2017-07-22 ENCOUNTER — Ambulatory Visit: Payer: Medicaid Other | Admitting: Obstetrics

## 2017-07-22 ENCOUNTER — Encounter: Payer: Self-pay | Admitting: Obstetrics

## 2017-07-22 VITALS — BP 125/80 | HR 87 | Wt 209.1 lb

## 2017-07-22 DIAGNOSIS — N944 Primary dysmenorrhea: Secondary | ICD-10-CM

## 2017-07-22 DIAGNOSIS — N898 Other specified noninflammatory disorders of vagina: Secondary | ICD-10-CM | POA: Diagnosis not present

## 2017-07-22 DIAGNOSIS — Z01419 Encounter for gynecological examination (general) (routine) without abnormal findings: Secondary | ICD-10-CM | POA: Insufficient documentation

## 2017-07-22 MED ORDER — IBUPROFEN 800 MG PO TABS: 800.0000 mg | ORAL_TABLET | Freq: Three times a day (TID) | ORAL | 5 refills | Status: DC | PRN

## 2017-07-22 NOTE — Progress Notes (Signed)
Patient is in the office for abdominal pain for the last 3 days. Pt states that pain eased up yesterday, but she would like std testing today.

## 2017-07-22 NOTE — Progress Notes (Signed)
Patient ID: Elizabeth Alvarez, female   DOB: [birth year], 32 y.o.   MRN: [id number]  Chief Complaint  Patient presents with  . GYN    HPI Elizabeth Alvarez is a 32 y.o. female.  Abdominal pain after period, resolved spontaneously a few days ago. HPI  Past Medical History:  Diagnosis Date  . Anemia    in the past  . Eczema     Past Surgical History:  Procedure Laterality Date  . LAPAROSCOPIC TUBAL LIGATION  03/29/2011   Procedure: LAPAROSCOPIC TUBAL LIGATION;  Surgeon: Roseanna Rainbow, MD;  Location: [facility];  Service: Gynecology;  Laterality: Bilateral;  . NO PAST SURGERIES    . WISDOM TOOTH EXTRACTION Bilateral    upper    Family History  Problem Relation Age of Onset  . Diabetes Mother   . Heart disease Mother   . Heart disease Father     Social History Social History   Tobacco Use  . Smoking status: Light Tobacco Smoker    Packs/day: 0.25    Types: Cigarettes  . Smokeless tobacco: Never Used  Substance Use Topics  . Alcohol use: Yes    Comment: occasionally liquor  . Drug use: No    Allergies  Allergen Reactions  . Aspirin Nausea And Vomiting  . Doxycycline Hives, Itching and Swelling  . Penicillins Cross Reactors Hives, Itching and Swelling    Has patient had a PCN reaction causing immediate rash, facial/tongue/throat swelling, SOB or lightheadedness with hypotension:yes Has patient had a PCN reaction causing severe rash involving mucus membranes or skin necrosis: no Has patient had a PCN reaction that required hospitalization- yes Has patient had a PCN reaction occurring within the last 10 years: yes, 7-8 yrs ago If all of the above answers are "NO", then may proceed with Cephalosporin use.   . Sulfa Drugs Cross Reactors Hives, Itching and Swelling    Current Outpatient Medications  Medication Sig Dispense Refill  . diphenhydrAMINE (BENADRYL) 25 mg capsule Take 1 capsule (25 mg total) by mouth every 4 (four) hours as needed for itching or  allergies. (Patient not taking: Reported on 11/22/2016) 30 capsule 0  . hydrocortisone 2.5 % cream Apply topically 2 (two) times daily as needed (itching). (Patient not taking: Reported on 11/22/2016) 15 g 0  . ibuprofen (ADVIL,MOTRIN) 800 MG tablet Take 1 tablet (800 mg total) by mouth every 8 (eight) hours as needed. 30 tablet 5  . loratadine (CLARITIN) 10 MG tablet Take 1 tablet (10 mg total) by mouth daily. (Patient not taking: Reported on 07/22/2017) 30 tablet 11  . metroNIDAZOLE (FLAGYL) 500 MG tablet Take 1 tablet (500 mg total) by mouth 2 (two) times daily. (Patient not taking: Reported on 07/22/2017) 14 tablet 2  . naproxen sodium (ANAPROX) 220 MG tablet Take 220 mg by mouth 2 (two) times daily as needed. For pain    . predniSONE (DELTASONE) 10 MG tablet 6,5,4,3,2,1 (Patient not taking: Reported on 11/22/2016) 21 tablet 0  . PRENATAL 27-1 MG TABS TAKE 1 TABLET BY MOUTH BEFORE BREAKFAST (Patient not taking: Reported on 07/22/2017) 30 tablet 11  . ranitidine (ZANTAC) 150 MG tablet Take 1 tablet (150 mg total) by mouth 2 (two) times daily. x7 days (Patient not taking: Reported on 11/22/2016) 14 tablet 0   No current facility-administered medications for this visit.     Review of Systems Review of Systems Constitutional: negative for fatigue and weight loss Respiratory: negative for cough and wheezing Cardiovascular: negative for chest pain, fatigue  and palpitations Gastrointestinal: positive for abdominal pain, and negative forchange in bowel habits Genitourinary:positive for pelvic pain Integument/breast: negative for nipple discharge Musculoskeletal:negative for myalgias Neurological: negative for gait problems and tremors Behavioral/Psych: negative for abusive relationship, depression Endocrine: negative for temperature intolerance      Blood pressure 125/80, pulse 87, weight 209 lb 1.6 oz (94.8 kg), last menstrual period 07/06/2017.  Physical Exam Physical Exam           General:  Alert and  no distress Abdomen:  normal findings: no organomegaly, soft, non-tender and no hernia  Pelvis:  External genitalia: normal general appearance Urinary system: urethral meatus normal and bladder without fullness, nontender Vaginal: normal without tenderness, induration or masses Cervix: normal appearance Adnexa: normal bimanual exam Uterus: anteverted and non-tender, normal size    50% of 15 min visit spent on counseling and coordination of care.   Data Reviewed Wet Prep Cultures  Assessment     1. Vaginal discharge Rx: - Cervicovaginal ancillary only  2. Primary dysmenorrhea Rx: - ibuprofen (ADVIL,MOTRIN) 800 MG tablet; Take 1 tablet (800 mg total) by mouth every 8 (eight) hours as needed.  Dispense: 30 tablet; Refill: 5    Plan   Follow up in 4 months for Annual  No orders of the defined types were placed in this encounter.  Meds ordered this encounter  Medications  . ibuprofen (ADVIL,MOTRIN) 800 MG tablet    Sig: Take 1 tablet (800 mg total) by mouth every 8 (eight) hours as needed.    Dispense:  30 tablet    Refill:  5    Brock Bad MD

## 2017-07-23 ENCOUNTER — Other Ambulatory Visit: Payer: Self-pay | Admitting: Obstetrics

## 2017-07-23 DIAGNOSIS — B9689 Other specified bacterial agents as the cause of diseases classified elsewhere: Secondary | ICD-10-CM

## 2017-07-23 DIAGNOSIS — A599 Trichomoniasis, unspecified: Secondary | ICD-10-CM

## 2017-07-23 DIAGNOSIS — N760 Acute vaginitis: Principal | ICD-10-CM

## 2017-07-23 LAB — CERVICOVAGINAL ANCILLARY ONLY
Bacterial vaginitis: POSITIVE — AB
CANDIDA VAGINITIS: NEGATIVE
CHLAMYDIA, DNA PROBE: NEGATIVE
NEISSERIA GONORRHEA: NEGATIVE
Trichomonas: POSITIVE — AB

## 2017-07-23 MED ORDER — TINIDAZOLE 500 MG PO TABS: 2.0000 g | ORAL_TABLET | Freq: Every day | ORAL | 0 refills | Status: DC

## 2017-08-03 ENCOUNTER — Other Ambulatory Visit: Payer: Self-pay | Admitting: Obstetrics

## 2017-08-03 DIAGNOSIS — B9689 Other specified bacterial agents as the cause of diseases classified elsewhere: Secondary | ICD-10-CM

## 2017-08-03 DIAGNOSIS — N760 Acute vaginitis: Principal | ICD-10-CM

## 2017-08-03 DIAGNOSIS — A599 Trichomoniasis, unspecified: Secondary | ICD-10-CM

## 2017-08-04 NOTE — Telephone Encounter (Signed)
Please review for refill.  

## 2017-08-05 MED ORDER — TINIDAZOLE 500 MG PO TABS: 2.0000 g | ORAL_TABLET | Freq: Every day | ORAL | 0 refills | Status: DC

## 2018-07-27 ENCOUNTER — Other Ambulatory Visit: Payer: Self-pay | Admitting: Obstetrics

## 2018-07-27 DIAGNOSIS — N944 Primary dysmenorrhea: Secondary | ICD-10-CM

## 2018-12-14 ENCOUNTER — Ambulatory Visit: Payer: Medicaid Other

## 2019-01-31 ENCOUNTER — Emergency Department (HOSPITAL_COMMUNITY)
Admission: EM | Admit: 2019-01-31 | Discharge: 2019-01-31 | Disposition: A | Discharge status: Home / Self Care | Payer: Medicaid Other | Attending: Emergency Medicine | Admitting: Emergency Medicine

## 2019-01-31 ENCOUNTER — Other Ambulatory Visit: Payer: Self-pay

## 2019-01-31 ENCOUNTER — Encounter (HOSPITAL_COMMUNITY): Payer: Self-pay | Admitting: Emergency Medicine

## 2019-01-31 DIAGNOSIS — F17200 Nicotine dependence, unspecified, uncomplicated: Secondary | ICD-10-CM | POA: Insufficient documentation

## 2019-01-31 DIAGNOSIS — L0291 Cutaneous abscess, unspecified: Secondary | ICD-10-CM

## 2019-01-31 DIAGNOSIS — L0231 Cutaneous abscess of buttock: Secondary | ICD-10-CM | POA: Insufficient documentation

## 2019-01-31 DIAGNOSIS — M791 Myalgia, unspecified site: Secondary | ICD-10-CM | POA: Diagnosis present

## 2019-01-31 DIAGNOSIS — Z79899 Other long term (current) drug therapy: Secondary | ICD-10-CM | POA: Diagnosis not present

## 2019-01-31 MED ORDER — CLINDAMYCIN HCL 300 MG PO CAPS: 300.0000 mg | ORAL_CAPSULE | Freq: Three times a day (TID) | ORAL | 0 refills | Status: DC

## 2019-01-31 MED ORDER — CLINDAMYCIN HCL 150 MG PO CAPS
300.0000 mg | ORAL_CAPSULE | Freq: Once | ORAL | Status: AC
  Administered 2019-01-31: 23:00:00 300 mg via ORAL
  Filled 2019-01-31: qty 2

## 2019-01-31 NOTE — ED Triage Notes (Signed)
Pt reports she feels like she has a pulled muscle to lower right sided back that runs to right buttock, reports onset after exercising

## 2019-01-31 NOTE — ED Notes (Signed)
Pt reports working out on Friday and felt pain to her gluteal area   Reports took aleve with relief but did not continue  Felt better yesterday but today felt worse so came to ED for eval   Pt ambulates heel to toe quickly with ease of gait to room

## 2019-01-31 NOTE — ED Notes (Signed)
TT in to assess 

## 2019-01-31 NOTE — Discharge Instructions (Addendum)
Apply warm wet compresses on and off or warm water soaks 2-3 times a day to the affected area.  Take the antibiotic as directed until its finished.  You may take Tylenol or ibuprofen if needed for pain.  Follow-up with your doctor or return to the ER for any worsening symptoms such as fever, chills, redness or swelling of your buttocks.

## 2019-01-31 NOTE — ED Provider Notes (Signed)
[facility] EMERGENCY DEPARTMENT Provider Note   CSN: [id number] Arrival date & time: 01/31/19  2039     History   Chief Complaint Chief Complaint  Patient presents with  . Muscle Pain    HPI Elizabeth Alvarez is a 33 y.o. female.     HPI   Elizabeth Alvarez is a 33 y.o. female who presents to the Emergency Department complaining of pain to her right buttock.  Symptoms have been present for several days.  She states that she has been working out lifting weights and was concerned that she has pulled a muscle.  She also states that she had a "[place] butt wax" 1 week ago.  She states the pain is intermittent and sharp.  Pain is worsened with sitting or walking.  She denies any pain radiating into her lower extremities or abdomen.  She also denies fever, chills, pain with defecation or urination or rash.    Past Medical History:  Diagnosis Date  . Anemia    in the past  . Eczema     Patient Active Problem List   Diagnosis Date Noted  . Contraceptive management 03/29/2011  . Pregnancy, supervision of normal 01/23/2011  . SROM (spontaneous rupture of membranes) 01/23/2011    Past Surgical History:  Procedure Laterality Date  . LAPAROSCOPIC TUBAL LIGATION  03/29/2011   Procedure: LAPAROSCOPIC TUBAL LIGATION;  Surgeon: Roseanna RainbowLisa A Jackson-Moore, MD;  Location: [facility];  Service: Gynecology;  Laterality: Bilateral;  . NO PAST SURGERIES    . WISDOM TOOTH EXTRACTION Bilateral    upper     OB History    Gravida  4   Para  2   Term  2   Preterm      AB  2   Living  2     SAB  1   TAB  1   Ectopic      Multiple      Live Births  2            Home Medications    Prior to Admission medications   Medication Sig Start Date End Date Taking? Authorizing Provider  clindamycin (CLEOCIN) 300 MG capsule Take 1 capsule (300 mg total) by mouth 3 (three) times daily. 01/31/19   Olean Sangster, PA-C  diphenhydrAMINE (BENADRYL) 25 mg capsule Take 1 capsule  (25 mg total) by mouth every 4 (four) hours as needed for itching or allergies. Patient not taking: Reported on 11/22/2016 07/20/16   Elson AreasSofia, Leslie K, PA-C  hydrocortisone 2.5 % cream Apply topically 2 (two) times daily as needed (itching). Patient not taking: Reported on 11/22/2016 11/26/15   Street, Harris[place]Mercedes, PA-C  ibuprofen (ADVIL,MOTRIN) 800 MG tablet TAKE 1 TABLET BY MOUTH EVERY 8 HOURS AS NEEDED 07/27/18   Brock BadHarper, Charles A, MD  loratadine (CLARITIN) 10 MG tablet Take 1 tablet (10 mg total) by mouth daily. Patient not taking: Reported on 07/22/2017 11/22/16   Brock BadHarper, Charles A, MD  metroNIDAZOLE (FLAGYL) 500 MG tablet Take 1 tablet (500 mg total) by mouth 2 (two) times daily. Patient not taking: Reported on 07/22/2017 11/26/16   Brock BadHarper, Charles A, MD  naproxen sodium (ANAPROX) 220 MG tablet Take 220 mg by mouth 2 (two) times daily as needed. For pain    [provider]  predniSONE (DELTASONE) 10 MG tablet 6,5,4,3,2,1 Patient not taking: Reported on 11/22/2016 07/20/16   Cheron SchaumannSofia, Leslie K, PA-C  PRENATAL 27-1 MG TABS TAKE 1 TABLET BY MOUTH BEFORE BREAKFAST Patient not taking:  Reported on 07/22/2017 05/04/17   Brock BadHarper, Charles A, MD  ranitidine (ZANTAC) 150 MG tablet Take 1 tablet (150 mg total) by mouth 2 (two) times daily. x7 days Patient not taking: Reported on 11/22/2016 11/26/15   Street, GaffneyMercedes, PA-C  tinidazole [facility](TINDAMAX) 500 MG tablet Take 4 tablets (2,000 mg total) by mouth daily with breakfast. 08/05/17   Brock BadHarper, Charles A, MD    Family History Family History  Problem Relation Age of Onset  . Diabetes Mother   . Heart disease Mother   . Heart disease Father     Social History Social History   Tobacco Use  . Smoking status: Light Tobacco Smoker    Packs/day: 0.25    Types: Cigarettes  . Smokeless tobacco: Never Used  Substance Use Topics  . Alcohol use: Yes    Comment: occasionally liquor  . Drug use: No     Allergies   Aspirin, Doxycycline, Penicillins cross reactors, and  Sulfa drugs cross reactors   Review of Systems Review of Systems  Constitutional: Negative for chills and fever.  Gastrointestinal: Negative for abdominal pain, nausea and vomiting.  Genitourinary: Negative for dysuria and flank pain.  Musculoskeletal: Negative for arthralgias and joint swelling.  Skin: Negative for color change.       Tenderness of the right buttock  Neurological: Negative for weakness and numbness.  Hematological: Negative for adenopathy.     Physical Exam Updated Vital Signs BP 133/88 (BP Location: Right Arm)   Pulse 84   Temp 98.5 F (36.9 C) (Oral)   Resp 16   LMP 01/23/2019   SpO2 100%   Physical Exam Vitals signs and nursing note reviewed.  Constitutional:      General: She is not in acute distress.    Appearance: Normal appearance. She is well-developed. She is not toxic-appearing.  HENT:     Head: Normocephalic and atraumatic.  Cardiovascular:     Rate and Rhythm: Normal rate and regular rhythm.     Pulses: Normal pulses.     Heart sounds: No murmur.  Pulmonary:     Effort: Pulmonary effort is normal. No respiratory distress.  Abdominal:     General: There is no distension.     Palpations: Abdomen is soft.     Tenderness: There is no abdominal tenderness.  Musculoskeletal: Normal range of motion.  Skin:    General: Skin is warm.     Findings: No erythema.     Comments: 2 cm area of fluctuance and tenderness to the right gluteal cleft.  Moderate purulent drainage noted.  No surrounding erythema or tenderness of the rectum  Neurological:     General: No focal deficit present.     Mental Status: She is alert and oriented to person, place, and time.     Sensory: No sensory deficit.     Motor: No weakness or abnormal muscle tone.      ED Treatments / Results  Labs (all labs ordered are listed, but only abnormal results are displayed) Labs Reviewed - No data to display  EKG None  Radiology No results found.  Procedures  Procedures (including critical care time)  Medications Ordered in ED Medications  clindamycin (CLEOCIN) capsule 300 mg (has no administration in time range)     Initial Impression / Assessment and Plan / ED Course  I have reviewed the triage vital signs and the nursing notes.  Pertinent labs & imaging results that were available during my care of the patient were reviewed by  me and considered in my medical decision making (see chart for details).      Patient with 2 cm fluctuant mass of the right gluteal cleft.  No involvement of the rectum.  Area is draining moderate amount of purulent material.  I have manually expressed most of this material without I&D.  She reports the area feels better, she is well-appearing and ambulatory without difficulty.  No surrounding erythema.  I feel she is appropriate for discharge home, she agrees to warm soaks, clindamycin and close outpatient follow-up.  I have also given return precautions and she agrees to plan.    Final Clinical Impressions(s) / ED Diagnoses   Final diagnoses:  Abscess    ED Discharge Orders         Ordered    clindamycin (CLEOCIN) 300 MG capsule  3 times daily     01/31/19 2226           Kem Parkinson, PA-C 01/31/19 2239    Milton Ferguson, MD 02/02/19 1300

## 2020-07-19 ENCOUNTER — Ambulatory Visit
Admission: RE | Admit: 2020-07-19 | Discharge: 2020-07-19 | Disposition: A | Discharge status: Home / Self Care | Payer: Medicaid Other | Source: Ambulatory Visit | Attending: Emergency Medicine | Admitting: Emergency Medicine

## 2020-07-19 ENCOUNTER — Other Ambulatory Visit: Payer: Self-pay

## 2020-07-19 VITALS — BP 127/87 | HR 86 | Temp 98.3°F | Resp 18

## 2020-07-19 DIAGNOSIS — J069 Acute upper respiratory infection, unspecified: Secondary | ICD-10-CM | POA: Diagnosis not present

## 2020-07-19 DIAGNOSIS — Z1152 Encounter for screening for COVID-19: Secondary | ICD-10-CM | POA: Diagnosis not present

## 2020-07-19 MED ORDER — FLUTICASONE PROPIONATE 50 MCG/ACT NA SUSP: 1.0000 | Freq: Every day | NASAL | 0 refills | Status: DC

## 2020-07-19 MED ORDER — PREDNISONE 10 MG PO TABS: 20.0000 mg | ORAL_TABLET | Freq: Every day | ORAL | 0 refills | Status: DC

## 2020-07-19 MED ORDER — CETIRIZINE HCL 10 MG PO TABS: 10.0000 mg | ORAL_TABLET | Freq: Every day | ORAL | 0 refills | Status: DC

## 2020-07-19 MED ORDER — BENZONATATE 100 MG PO CAPS: 100.0000 mg | ORAL_CAPSULE | Freq: Three times a day (TID) | ORAL | 0 refills | Status: DC | PRN

## 2020-07-19 NOTE — ED Provider Notes (Addendum)
[facility] CARE CENTER   [id number] 07/19/20 Arrival Time: 1320   CC: COVID symptoms  SUBJECTIVE: History from: patient.  Elizabeth Alvarez is a 35 y.o. female who presented to the urgent care with a complaint of  nasal congestion, and cough for the past few days.  Denies sick exposure to COVID, flu or strep.  Denies recent travel.  Has tried OTC medication without relief.  Denies any aggravating factors.  Denies previous symptoms in the past.   Denies fever, chills, fatigue, sinus pain, rhinorrhea, sore throat, SOB, wheezing, chest pain, nausea, changes in bowel or bladder habits.     ROS: As per HPI.  All other pertinent ROS negative.      Past Medical History:  Diagnosis Date  . Anemia    in the past  . Eczema    Past Surgical History:  Procedure Laterality Date  . LAPAROSCOPIC TUBAL LIGATION  03/29/2011   Procedure: LAPAROSCOPIC TUBAL LIGATION;  Surgeon: Roseanna Rainbow, MD;  Location: [facility];  Service: Gynecology;  Laterality: Bilateral;  . NO PAST SURGERIES    . WISDOM TOOTH EXTRACTION Bilateral    upper   Allergies  Allergen Reactions  . Aspirin Nausea And Vomiting  . Doxycycline Hives, Itching and Swelling  . Penicillins Cross Reactors Hives, Itching and Swelling    Has patient had a PCN reaction causing immediate rash, facial/tongue/throat swelling, SOB or lightheadedness with hypotension:yes Has patient had a PCN reaction causing severe rash involving mucus membranes or skin necrosis: no Has patient had a PCN reaction that required hospitalization- yes Has patient had a PCN reaction occurring within the last 10 years: yes, 7-8 yrs ago If all of the above answers are "NO", then may proceed with Cephalosporin use.   . Sulfa Drugs Cross Reactors Hives, Itching and Swelling   No current facility-administered medications on file prior to encounter.   Current Outpatient Medications on File Prior to Encounter  Medication Sig Dispense Refill  . clindamycin  (CLEOCIN) 300 MG capsule Take 1 capsule (300 mg total) by mouth 3 (three) times daily. 21 capsule 0  . diphenhydrAMINE (BENADRYL) 25 mg capsule Take 1 capsule (25 mg total) by mouth every 4 (four) hours as needed for itching or allergies. (Patient not taking: Reported on 11/22/2016) 30 capsule 0  . hydrocortisone 2.5 % cream Apply topically 2 (two) times daily as needed (itching). (Patient not taking: Reported on 11/22/2016) 15 g 0  . ibuprofen (ADVIL,MOTRIN) 800 MG tablet TAKE 1 TABLET BY MOUTH EVERY 8 HOURS AS NEEDED 30 tablet 5  . loratadine (CLARITIN) 10 MG tablet Take 1 tablet (10 mg total) by mouth daily. (Patient not taking: Reported on 07/22/2017) 30 tablet 11  . metroNIDAZOLE (FLAGYL) 500 MG tablet Take 1 tablet (500 mg total) by mouth 2 (two) times daily. (Patient not taking: Reported on 07/22/2017) 14 tablet 2  . naproxen sodium (ANAPROX) 220 MG tablet Take 220 mg by mouth 2 (two) times daily as needed. For pain    . PRENATAL 27-1 MG TABS TAKE 1 TABLET BY MOUTH BEFORE BREAKFAST (Patient not taking: Reported on 07/22/2017) 30 tablet 11  . ranitidine (ZANTAC) 150 MG tablet Take 1 tablet (150 mg total) by mouth 2 (two) times daily. x7 days (Patient not taking: Reported on 11/22/2016) 14 tablet 0  . tinidazole (TINDAMAX) 500 MG tablet Take 4 tablets (2,000 mg total) by mouth daily with breakfast. 8 tablet 0   Social History   Socioeconomic History  . Marital status: Single  Spouse name: Not on file  . Number of children: Not on file  . Years of education: Not on file  . Highest education level: Not on file  Occupational History  . Not on file  Tobacco Use  . Smoking status: Light Tobacco Smoker    Packs/day: 0.25    Types: Cigarettes  . Smokeless tobacco: Never Used  Vaping Use  . Vaping Use: Never used  Substance and Sexual Activity  . Alcohol use: Yes    Comment: occasionally liquor  . Drug use: No  . Sexual activity: Yes    Partners: Male    Birth control/protection: Surgical   Other Topics Concern  . Not on file  Social History Narrative  . Not on file   Social Determinants of Health   Financial Resource Strain: Not on file  Food Insecurity: Not on file  Transportation Needs: Not on file  Physical Activity: Not on file  Stress: Not on file  Social Connections: Not on file  Intimate Partner Violence: Not on file   Family History  Problem Relation Age of Onset  . Diabetes Mother   . Heart disease Mother   . Heart disease Father     OBJECTIVE:  Vitals:   07/19/20 1332  BP: 127/87  Pulse: 86  Resp: 18  Temp: 98.3 F (36.8 C)  SpO2: 96%     General appearance: alert; appears fatigued, but nontoxic; speaking in full sentences and tolerating own secretions HEENT: NCAT; Ears: EACs clear, TMs pearly gray; Eyes: PERRL.  EOM grossly intact. Sinuses: nontender; Nose: nares patent without rhinorrhea, Throat: oropharynx clear, tonsils non erythematous or enlarged, uvula midline  Neck: supple without LAD Lungs: unlabored respirations, symmetrical air entry; cough: moderate; no respiratory distress; CTAB Heart: regular rate and rhythm.  Radial pulses 2+ symmetrical bilaterally Skin: warm and dry Psychological: alert and cooperative; normal mood and affect  LABS:  No results found for this or any previous visit (from the past 24 hour(s)).   ASSESSMENT & PLAN:  1. URI with cough and congestion   2. Encounter for screening for COVID-19     Meds ordered this encounter  Medications  . cetirizine (ZYRTEC ALLERGY) 10 MG tablet    Sig: Take 1 tablet (10 mg total) by mouth daily.    Dispense:  30 tablet    Refill:  0  . fluticasone (FLONASE) 50 MCG/ACT nasal spray    Sig: Place 1 spray into both nostrils daily for 14 days.    Dispense:  16 g    Refill:  0  . predniSONE (DELTASONE) 10 MG tablet    Sig: Take 2 tablets (20 mg total) by mouth daily.    Dispense:  15 tablet    Refill:  0  . benzonatate (TESSALON) 100 MG capsule    Sig: Take 1 capsule  (100 mg total) by mouth 3 (three) times daily as needed for cough.    Dispense:  30 capsule    Refill:  0    Discharge instructions  COVID testing ordered.  It will take between 2-7 days for test results.  Someone will contact you regarding abnormal results.    Get plenty of rest and push fluids Tessalon Perles prescribed for cough zyrtec for nasal congestion, runny nose, and/or sore throat  flonase for nasal congestion and runny nose Prednisone was prescribed Use medications daily for symptom relief Use OTC medications like ibuprofen or tylenol as needed fever or pain Call or go to the ED if  you have any new or worsening symptoms such as fever, worsening cough, shortness of breath, chest tightness, chest pain, turning blue, changes in mental status, etc...   Reviewed expectations re: course of current medical issues. Questions answered. Outlined signs and symptoms indicating need for more acute intervention. Patient verbalized understanding. After Visit Summary given.         Durward Parcel, FNP 07/19/20 1353    Durward Parcel, FNP 07/19/20 1354

## 2020-07-19 NOTE — Discharge Instructions (Signed)
COVID testing ordered.  It will take between 2-7 days for test results.  Someone will contact you regarding abnormal results.    Get plenty of rest and push fluids Tessalon Perles prescribed for cough zyrtec for nasal congestion, runny nose, and/or sore throat  flonase for nasal congestion and runny nose Prednisone was prescribed Use medications daily for symptom relief Use OTC medications like ibuprofen or tylenol as needed fever or pain Call or go to the ED if you have any new or worsening symptoms such as fever, worsening cough, shortness of breath, chest tightness, chest pain, turning blue, changes in mental status, etc..Marland Kitchen

## 2020-07-19 NOTE — ED Triage Notes (Signed)
Pt presents with c/o nasal congestion that began a few days ago

## 2020-07-20 LAB — NOVEL CORONAVIRUS, NAA: SARS-CoV-2, NAA: NOT DETECTED

## 2020-07-20 LAB — SARS-COV-2, NAA 2 DAY TAT

## 2020-08-28 ENCOUNTER — Ambulatory Visit: Payer: Self-pay

## 2020-08-29 ENCOUNTER — Ambulatory Visit
Admission: RE | Admit: 2020-08-29 | Discharge: 2020-08-29 | Disposition: A | Discharge status: Home / Self Care | Payer: Medicaid Other | Source: Ambulatory Visit | Attending: Emergency Medicine | Admitting: Emergency Medicine

## 2020-08-29 ENCOUNTER — Other Ambulatory Visit: Payer: Self-pay

## 2020-08-29 NOTE — ED Triage Notes (Signed)
covid test only

## 2020-08-30 LAB — SARS-COV-2, NAA 2 DAY TAT

## 2020-08-30 LAB — NOVEL CORONAVIRUS, NAA: SARS-CoV-2, NAA: NOT DETECTED

## 2021-10-07 ENCOUNTER — Emergency Department (HOSPITAL_COMMUNITY)
Admission: EM | Admit: 2021-10-07 | Discharge: 2021-10-07 | Disposition: A | Discharge status: Home / Self Care | Payer: Medicaid Other | Attending: Emergency Medicine | Admitting: Emergency Medicine

## 2021-10-07 ENCOUNTER — Encounter (HOSPITAL_COMMUNITY): Payer: Self-pay | Admitting: Emergency Medicine

## 2021-10-07 ENCOUNTER — Other Ambulatory Visit: Payer: Self-pay

## 2021-10-07 DIAGNOSIS — L235 Allergic contact dermatitis due to other chemical products: Secondary | ICD-10-CM | POA: Insufficient documentation

## 2021-10-07 DIAGNOSIS — H5712 Ocular pain, left eye: Secondary | ICD-10-CM | POA: Diagnosis present

## 2021-10-07 MED ORDER — FAMOTIDINE 20 MG PO TABS: 20.0000 mg | ORAL_TABLET | Freq: Two times a day (BID) | ORAL | 0 refills | Status: DC

## 2021-10-07 MED ORDER — DEXAMETHASONE SODIUM PHOSPHATE 10 MG/ML IJ SOLN
10.0000 mg | Freq: Once | INTRAMUSCULAR | Status: AC
  Administered 2021-10-07: 10 mg via INTRAMUSCULAR
  Filled 2021-10-07: qty 1

## 2021-10-07 MED ORDER — HYDROXYZINE HCL 25 MG PO TABS: 25.0000 mg | ORAL_TABLET | Freq: Four times a day (QID) | ORAL | 0 refills | Status: DC | PRN

## 2021-10-07 MED ORDER — TRIAMCINOLONE ACETONIDE 0.1 % EX LOTN: 1.0000 "application " | TOPICAL_LOTION | Freq: Two times a day (BID) | CUTANEOUS | 0 refills | Status: DC

## 2021-10-07 MED ORDER — PREDNISONE 10 MG PO TABS: ORAL_TABLET | ORAL | 0 refills | Status: DC

## 2021-10-07 NOTE — Discharge Instructions (Addendum)
Start the prednisone tablets tomorrow as you have received a steroid injection here today.  You may also apply the steroid cream prescribed to your ears only, rub in twice daily until the swelling has resolved.  Do not use the cream on your face.  You have also been prescribed hydroxyzine which is an antihistamine which may also help resolve this allergic reaction.  Additionally Pepcid is an antihistamine that works in the different way and may help also break this allergic reaction cycle and has also been prescribed to you.  Get rechecked for any worsening symptoms. ? ?Do not apply hair dye in the future, especially if this particular brand and make sure you are doing an allergy patch test which most hair dyes, with instructions to do ahead of time to make sure you do not have a reaction, in the event you feel you need to try coloring your hair in the future. ?

## 2021-10-07 NOTE — ED Provider Notes (Signed)
?[facility] ?Provider Note ? ? ?CSN: [id number] ?Arrival date & time: 10/07/21  1205 ? ?  ? ?History ? ?Chief Complaint  ?Patient presents with  ? Allergic Reaction  ? ? ?Elizabeth Alvarez is a 36 y.o. female presenting for evaluation of persistent pain and swelling of her bilateral ears and now extending to the left side of her face since she used haircoloring on her hair at the end of March.  She used an over-the-counter Clairol brand hair dye and within 24 hours she had an allergic reaction to her scalp and ears.  She was treated with a 2-week course of prednisone and by the end of the 2 weeks her symptoms were mostly improved.  However after completing this medication her symptoms returned.  She does not have any scalp involvement at this time, her symptoms are isolated to her bilateral ears and now her left temple and cheek area.  She denies any shortness of breath, mouth or tongue swelling, wheezing, cough.  There has been some clear drainage from the external ear, denies any purulent drainage or honey crusted drainage.  She has been using cold compresses, hot compresses has also taken Claritin and Benadryl which has offered minimal symptom improvement. ? ?The history is provided by the patient.  ? ?  ? ?Home Medications ?Prior to Admission medications   ?Medication Sig Start Date End Date Taking? Authorizing Provider  ?diphenhydrAMINE (BENADRYL) 25 mg capsule Take 1 capsule (25 mg total) by mouth every 4 (four) hours as needed for itching or allergies. 07/20/16  Yes Cheron Schaumann K, PA-C  ?famotidine (PEPCID) 20 MG tablet Take 1 tablet (20 mg total) by mouth 2 (two) times daily. 10/07/21  Yes Jimmylee Ratterree, Raynelle Fanning, PA-C  ?hydrocortisone 2.5 % cream Apply topically 2 (two) times daily as needed (itching). 11/26/15  Yes Street, [place], PA-C  ?hydrOXYzine (ATARAX) 25 MG tablet Take 1 tablet (25 mg total) by mouth every 6 (six) hours as needed for itching. 10/07/21  Yes Swetha Rayle, Raynelle Fanning, PA-C  ?loratadine  (CLARITIN) 10 MG tablet Take 1 tablet (10 mg total) by mouth daily. 11/22/16  Yes Brock Bad, MD  ?predniSONE (DELTASONE) 10 MG tablet Take 6 tabs by mouth daily  for 2 days, then 5 tabs for 2 days, then 4 tabs for 2 days, then 3 tabs for 2 days, 2 tabs for 2 days, then 1 tab by mouth daily for 2 days 10/08/21  Yes Mariusz Jubb, Raynelle Fanning, PA-C  ?triamcinolone lotion (KENALOG) 0.1 % Apply 1 application. topically 2 (two) times daily. Apply to ears twice daily.  Avoid use around eyes. 10/07/21  Yes Hisako Bugh, Raynelle Fanning, PA-C  ?benzonatate (TESSALON) 100 MG capsule Take 1 capsule (100 mg total) by mouth 3 (three) times daily as needed for cough. ?Patient not taking: Reported on 10/07/2021 07/19/20   Durward Parcel, FNP  ?cetirizine (ZYRTEC ALLERGY) 10 MG tablet Take 1 tablet (10 mg total) by mouth daily. ?Patient not taking: Reported on 10/07/2021 07/19/20   Durward Parcel, FNP  ?clindamycin (CLEOCIN) 300 MG capsule Take 1 capsule (300 mg total) by mouth 3 (three) times daily. ?Patient not taking: Reported on 10/07/2021 01/31/19   Pauline Aus, PA-C  ?fluticasone (FLONASE) 50 MCG/ACT nasal spray Place 1 spray into both nostrils daily for 14 days. ?Patient not taking: Reported on 10/07/2021 07/19/20 08/02/20  Durward Parcel, FNP  ?ibuprofen (ADVIL,MOTRIN) 800 MG tablet TAKE 1 TABLET BY MOUTH EVERY 8 HOURS AS NEEDED ?Patient not taking: Reported on 10/07/2021 07/27/18  Brock BadHarper, Charles A, MD  ?metroNIDAZOLE (FLAGYL) 500 MG tablet Take 1 tablet (500 mg total) by mouth 2 (two) times daily. ?Patient not taking: Reported on 07/22/2017 11/26/16   Brock BadHarper, Charles A, MD  ?naproxen sodium (ANAPROX) 220 MG tablet Take 220 mg by mouth 2 (two) times daily as needed. For pain ?Patient not taking: Reported on 10/07/2021    [provider]  ?predniSONE (DELTASONE) 10 MG tablet Take 2 tablets (20 mg total) by mouth daily. ?Patient not taking: Reported on 10/07/2021 07/19/20   Durward ParcelAvegno, Komlanvi S, FNP  ?PRENATAL 27-1 MG TABS TAKE 1 TABLET BY MOUTH  BEFORE BREAKFAST ?Patient not taking: No sig reported 05/04/17   Brock BadHarper, Charles A, MD  ?ranitidine (ZANTAC) 150 MG tablet Take 1 tablet (150 mg total) by mouth 2 (two) times daily. x7 days ?Patient not taking: Reported on 11/22/2016 11/26/15   Street, [place], PA-C  ?tinidazole (TINDAMAX) 500 MG tablet Take 4 tablets (2,000 mg total) by mouth daily with breakfast. ?Patient not taking: Reported on 10/07/2021 08/05/17   Brock BadHarper, Charles A, MD  ?   ? ?Allergies    ?Aspirin, Doxycycline, Penicillins cross reactors, and Sulfa drugs cross reactors   ? ?Review of Systems   ?Review of Systems  ?Constitutional:  Negative for chills and fever.  ?HENT:  Positive for facial swelling.   ?Respiratory:  Negative for shortness of breath, wheezing and stridor.   ?Skin:  Positive for rash.  ?Neurological:  Negative for numbness.  ?All other systems reviewed and are negative. ? ?Physical Exam ?Updated Vital Signs ?BP 116/79 (BP Location: Right Arm)   Pulse 76   Temp 97.9 ?F (36.6 ?C) (Oral)   Resp 16   Ht 5\' 6"  (1.676 m)   Wt 85.3 kg   LMP 10/07/2021   SpO2 100%   BMI 30.34 kg/m?  ?Physical Exam ?Vitals and nursing note reviewed.  ?Constitutional:   ?   Appearance: She is well-developed.  ?HENT:  ?   Head: Normocephalic and atraumatic.  ?   Right Ear: Tympanic membrane normal. Swelling present.  ?   Left Ear: Tympanic membrane normal. Swelling present.  ?   Ears:  ?   Comments: There is significant bilateral external ear soft tissue edema.  There is mild erythema, no pustules, no draining lesions.  Mild edema noted left temple and maxilla without appreciable rash.  Scalp appears healthy. ?Eyes:  ?   Conjunctiva/sclera: Conjunctivae normal.  ?Cardiovascular:  ?   Rate and Rhythm: Normal rate.  ?Pulmonary:  ?   Effort: Pulmonary effort is normal.  ?   Breath sounds: Normal breath sounds. No wheezing.  ?Musculoskeletal:     ?   General: Normal range of motion.  ?   Cervical back: Normal range of motion.  ?Skin: ?   General: Skin is  warm and dry.  ?Neurological:  ?   Mental Status: She is alert.  ? ? ?ED Results / Procedures / Treatments   ?Labs ?(all labs ordered are listed, but only abnormal results are displayed) ?Labs Reviewed - No data to display ? ?EKG ?None ? ?Radiology ?No results found. ? ?Procedures ?Procedures  ? ? ?Medications Ordered in ED ?Medications  ?dexamethasone (DECADRON) injection 10 mg (10 mg Intramuscular Given 10/07/21 1319)  ? ? ?ED Course/ Medical Decision Making/ A&P ?  ?                        ?Medical Decision Making ?Patient with a significant  chemical contact dermatitis of her bilateral ears, minimal involvement of the left side of her face, orbits sparing.  She was given an IM dose of Decadron here.  We will repeat a 12-day prednisone taper.  She was also prescribed Atarax, Pepcid for antihistamine effect.  Also prescribed triamcinolone lotion for her bilateral ears topical application, advised that she should not use the lotion on her face, especially around her eye.  There is no evidence of a superimposed bacterial infection.  She was advised that she should not use this hair dye in the future.  She does state that she used the same hair dye about 5 years ago with a similar reaction but states some of the time is passed she did not think it would happen again.  She was strongly encouraged not to use this same product again. ? ?Risk ?Prescription drug management. ? ? ? ? ? ? ? ? ? ? ?Final Clinical Impression(s) / ED Diagnoses ?Final diagnoses:  ?Chemical induced allergic contact dermatitis  ? ? ?Rx / DC Orders ?ED Discharge Orders   ? ?      Ordered  ?  predniSONE (DELTASONE) 10 MG tablet       ? 10/07/21 1310  ?  hydrOXYzine (ATARAX) 25 MG tablet  Every 6 hours PRN       ? 10/07/21 1310  ?  triamcinolone lotion (KENALOG) 0.1 %  2 times daily       ? 10/07/21 1310  ?  famotidine (PEPCID) 20 MG tablet  2 times daily       ? 10/07/21 1315  ? ?  ?  ? ?  ? ? ?  ?Burgess Amor, PA-C ?10/07/21 1800 ? ?  ?Eber Hong,  MD ?10/12/21 [phone] ? ?

## 2021-10-07 NOTE — ED Triage Notes (Signed)
Patient c/o swelling to ears and face with itching. Denies any difficulty breath or swallowing. Per patient dyed hair at end of March and started to have allergic reaction was seen by Urgent care and was given prednisone and told to take Claritin. Patient states swelling and itching dissipated but returned shortly after finishing medication. Patient reports brief relief in itching with Claritin. Patient reports taking benadryl 25mg  x3 and tylenol 500mg  x2 this morning.  ?

## 2021-10-07 NOTE — ED Notes (Signed)
ED Provider at bedside. 

## 2021-10-25 ENCOUNTER — Emergency Department (HOSPITAL_COMMUNITY)
Admission: EM | Admit: 2021-10-25 | Discharge: 2021-10-25 | Disposition: A | Discharge status: Home / Self Care | Payer: Medicaid Other | Attending: Emergency Medicine | Admitting: Emergency Medicine

## 2021-10-25 ENCOUNTER — Other Ambulatory Visit: Payer: Self-pay

## 2021-10-25 ENCOUNTER — Encounter (HOSPITAL_COMMUNITY): Payer: Self-pay | Admitting: *Deleted

## 2021-10-25 DIAGNOSIS — R220 Localized swelling, mass and lump, head: Secondary | ICD-10-CM | POA: Insufficient documentation

## 2021-10-25 DIAGNOSIS — R21 Rash and other nonspecific skin eruption: Secondary | ICD-10-CM | POA: Insufficient documentation

## 2021-10-25 MED ORDER — EPINEPHRINE 0.3 MG/0.3ML IJ SOAJ: 0.3000 mg | INTRAMUSCULAR | 0 refills | Status: AC | PRN

## 2021-10-25 MED ORDER — CLINDAMYCIN HCL 150 MG PO CAPS
300.0000 mg | ORAL_CAPSULE | Freq: Two times a day (BID) | ORAL | 0 refills | Status: AC
Start: 2021-10-25 — End: 2021-11-01

## 2021-10-25 MED ORDER — PREDNISONE 10 MG PO TABS: 30.0000 mg | ORAL_TABLET | Freq: Every day | ORAL | 0 refills | Status: AC

## 2021-10-25 NOTE — ED Provider Notes (Signed)
?[facility] EMERGENCY DEPARTMENT ?Provider Note ? ? ?CSN: [id number] ?Arrival date & time: 10/25/21  1456 ? ?  ? ?History ? ?Chief Complaint  ?Patient presents with  ? Rash  ? ? ?Elizabeth Alvarez is a 36 y.o. female. ? ?HPI ? ?Patient without significant medical history presents with complaints of a rash on her face.  Patient states this happened about 6 days ago, she states that she got her hair dyed and then all of a sudden had swelling on her face, she states she has a redness and itchiness around the area she also has redness and she is behind her ears, she denies any tongue throat lip swelling difficulty breathing, denies any GI symptoms.  She says she has had this happen to her in the past, every time she gets her hair ?She gets reaction from this.  states that she went to urgent care on the fifth the placed on a short course of steroids which resolved the rash but when she was trying to wash her ears on the 10th some water which touch her hair then went down her face and touch her lips and then started to have redness around her face again.  She states the redness has not gotten any worse, she has no other complaints. ? ?I reviewed patient's chart she was seen in the past for similar presentation, she was given dexamethasone as well as a taper of steroids and was later discharged. ? ?Home Medications ?Prior to Admission medications   ?Medication Sig Start Date End Date Taking? Authorizing Provider  ?clindamycin (CLEOCIN) 150 MG capsule Take 2 capsules (300 mg total) by mouth 2 (two) times daily for 7 days. 10/25/21 11/01/21 Yes [name], PA-C  ?EPINEPHrine 0.3 mg/0.3 mL IJ SOAJ injection Inject 0.3 mg into the muscle as needed for anaphylaxis. 10/25/21  Yes [name], PA-C  ?predniSONE (DELTASONE) 10 MG tablet Take 3 tablets (30 mg total) by mouth daily for 5 days. 10/25/21 10/30/21 Yes [name], PA-C  ?benzonatate (TESSALON) 100 MG capsule Take 1 capsule (100 mg total) by mouth  3 (three) times daily as needed for cough. ?Patient not taking: Reported on 10/07/2021 07/19/20   Durward Parcel, FNP  ?cetirizine (ZYRTEC ALLERGY) 10 MG tablet Take 1 tablet (10 mg total) by mouth daily. ?Patient not taking: Reported on 10/07/2021 07/19/20   Durward Parcel, FNP  ?diphenhydrAMINE (BENADRYL) 25 mg capsule Take 1 capsule (25 mg total) by mouth every 4 (four) hours as needed for itching or allergies. 07/20/16   Elson Areas, PA-C  ?famotidine (PEPCID) 20 MG tablet Take 1 tablet (20 mg total) by mouth 2 (two) times daily. 10/07/21   Burgess Amor, PA-C  ?fluticasone (FLONASE) 50 MCG/ACT nasal spray Place 1 spray into both nostrils daily for 14 days. ?Patient not taking: Reported on 10/07/2021 07/19/20 08/02/20  Durward Parcel, FNP  ?hydrocortisone 2.5 % cream Apply topically 2 (two) times daily as needed (itching). 11/26/15   Street, [place], PA-C  ?hydrOXYzine (ATARAX) 25 MG tablet Take 1 tablet (25 mg total) by mouth every 6 (six) hours as needed for itching. 10/07/21   Burgess Amor, PA-C  ?ibuprofen (ADVIL,MOTRIN) 800 MG tablet TAKE 1 TABLET BY MOUTH EVERY 8 HOURS AS NEEDED ?Patient not taking: Reported on 10/07/2021 07/27/18   Brock Bad, MD  ?loratadine (CLARITIN) 10 MG tablet Take 1 tablet (10 mg total) by mouth daily. 11/22/16   Brock Bad, MD  ?metroNIDAZOLE (FLAGYL) 500 MG  tablet Take 1 tablet (500 mg total) by mouth 2 (two) times daily. ?Patient not taking: Reported on 07/22/2017 11/26/16   Brock BadHarper, Charles A, MD  ?naproxen sodium (ANAPROX) 220 MG tablet Take 220 mg by mouth 2 (two) times daily as needed. For pain ?Patient not taking: Reported on 10/07/2021    [provider]  ?predniSONE (DELTASONE) 10 MG tablet Take 2 tablets (20 mg total) by mouth daily. ?Patient not taking: Reported on 10/07/2021 07/19/20   Durward ParcelAvegno, Komlanvi S, FNP  ?predniSONE (DELTASONE) 10 MG tablet Take 6 tabs by mouth daily  for 2 days, then 5 tabs for 2 days, then 4 tabs for 2 days, then 3 tabs for 2 days, 2  tabs for 2 days, then 1 tab by mouth daily for 2 days 10/08/21   Burgess AmorIdol, Julie, PA-C  ?PRENATAL 27-1 MG TABS TAKE 1 TABLET BY MOUTH BEFORE BREAKFAST ?Patient not taking: No sig reported 05/04/17   Brock BadHarper, Charles A, MD  ?ranitidine (ZANTAC) 150 MG tablet Take 1 tablet (150 mg total) by mouth 2 (two) times daily. x7 days ?Patient not taking: Reported on 11/22/2016 11/26/15   Street, [place], PA-C  ?tinidazole (TINDAMAX) 500 MG tablet Take 4 tablets (2,000 mg total) by mouth daily with breakfast. ?Patient not taking: Reported on 10/07/2021 08/05/17   Brock BadHarper, Charles A, MD  ?triamcinolone lotion (KENALOG) 0.1 % Apply 1 application. topically 2 (two) times daily. Apply to ears twice daily.  Avoid use around eyes. 10/07/21   Burgess AmorIdol, Julie, PA-C  ?   ? ?Allergies    ?Aspirin, Doxycycline, Penicillins cross reactors, and Sulfa drugs cross reactors   ? ?Review of Systems   ?Review of Systems  ?Constitutional:  Negative for chills and fever.  ?Respiratory:  Negative for shortness of breath.   ?Cardiovascular:  Negative for chest pain.  ?Gastrointestinal:  Negative for abdominal pain.  ?Skin:  Positive for rash.  ?Neurological:  Negative for headaches.  ? ?Physical Exam ?Updated Vital Signs ?BP 135/86 (BP Location: Right Arm)   Pulse 87   Temp 98.5 ?F (36.9 ?C) (Oral)   Resp 18   Ht 5\' 6"  (1.676 m)   Wt 85.2 kg   LMP 10/07/2021   SpO2 99%   BMI 30.32 kg/m?  ?Physical Exam ?Vitals and nursing note reviewed.  ?Constitutional:   ?   General: She is not in acute distress. ?   Appearance: She is not ill-appearing.  ?HENT:  ?   Head: Normocephalic and atraumatic.  ?   Comments: Patient is noted erythematous rash around her perioral region, slightly scaling, blanches with pressure, itchy and nontender to palpation no flexion induration noted.  Patient has the same rash behind ears bilaterally, slight abrasions seen but no evidence of infection present. ?   Nose: No congestion.  ?   Mouth/Throat:  ?   Mouth: Mucous membranes are moist.   ?   Pharynx: Oropharynx is clear. No oropharyngeal exudate or posterior oropharyngeal erythema.  ?   Comments: No trismus no torticollis tongue and uvula both midline controlling oral secretions tonsils equal symmetric bilaterally, no submandibular swelling present. ?Eyes:  ?   Conjunctiva/sclera: Conjunctivae normal.  ?Cardiovascular:  ?   Rate and Rhythm: Normal rate and regular rhythm.  ?   Pulses: Normal pulses.  ?   Heart sounds: No murmur heard. ?  No friction rub. No gallop.  ?Pulmonary:  ?   Effort: Pulmonary effort is normal. No respiratory distress.  ?Skin: ?   General: Skin is warm  and dry.  ?   Comments: No systemic rash seen on on patient, rash isolated to her perioral region as well as her ears.  ?Neurological:  ?   Mental Status: She is alert.  ?Psychiatric:     ?   Mood and Affect: Mood normal.  ? ? ?ED Results / Procedures / Treatments   ?Labs ?(all labs ordered are listed, but only abnormal results are displayed) ?Labs Reviewed - No data to display ? ?EKG ?None ? ?Radiology ?No results found. ? ?Procedures ?Procedures  ? ? ?Medications Ordered in ED ?Medications - No data to display ? ?ED Course/ Medical Decision Making/ A&P ?  ?                        ?Medical Decision Making ? ?This patient presents to the ED for concern of rash, this involves an extensive number of treatment options, and is a complaint that carries with it a high risk of complications and morbidity.  The differential diagnosis includes angioedema, anaphylaxis, cellulitis, abscess ? ? ? ?Additional history obtained: ? ?Additional history obtained from N/A ?External records from outside source obtained and reviewed including previous ED notes, imaging ? ? ?Co morbidities that complicate the patient evaluation ? ?N/A ? ?Social Determinants of Health: ? ?N/A ? ? ? ?Lab Tests: ? ?I Ordered, and personally interpreted labs.  The pertinent results include: N/A ? ? ?Imaging Studies ordered: ? ?I ordered imaging studies including N/A ?I  independently visualized and interpreted imaging which showed N/A ?I agree with the radiologist interpretation ? ? ?Cardiac Monitoring: ? ?The patient was maintained on a cardiac monitor.  I personally view

## 2021-10-25 NOTE — ED Triage Notes (Signed)
Pt has rash around mouth;  ?

## 2021-10-25 NOTE — Discharge Instructions (Signed)
Started you on steroids please take as prescribed.  Please continue with Pepcid as well as Claritin/Benadryl for the next 7 days.  I have started you on antibiotics please take as prescribed.  Aslo given you an EpiPen please only use if you have tongue throat lip swelling difficulty breathing if he uses you must come back in for reevaluation ? ?Please follow-up with an allergist for further evaluation ? ?Come back to the emergency department if you develop chest pain, shortness of breath, severe abdominal pain, uncontrolled nausea, vomiting, diarrhea. ? ?

## 2021-12-13 ENCOUNTER — Ambulatory Visit: Payer: Medicaid Other | Admitting: Family Medicine

## 2021-12-13 ENCOUNTER — Encounter: Payer: Self-pay | Admitting: Family Medicine

## 2021-12-13 VITALS — BP 118/80 | HR 97 | Ht 66.0 in | Wt 196.6 lb

## 2021-12-13 DIAGNOSIS — T782XXD Anaphylactic shock, unspecified, subsequent encounter: Secondary | ICD-10-CM

## 2021-12-13 DIAGNOSIS — Z0001 Encounter for general adult medical examination with abnormal findings: Secondary | ICD-10-CM | POA: Diagnosis not present

## 2021-12-13 DIAGNOSIS — T7840XD Allergy, unspecified, subsequent encounter: Secondary | ICD-10-CM

## 2021-12-13 DIAGNOSIS — E559 Vitamin D deficiency, unspecified: Secondary | ICD-10-CM

## 2021-12-13 DIAGNOSIS — T7840XA Allergy, unspecified, initial encounter: Secondary | ICD-10-CM | POA: Insufficient documentation

## 2021-12-13 DIAGNOSIS — R21 Rash and other nonspecific skin eruption: Secondary | ICD-10-CM | POA: Diagnosis not present

## 2021-12-13 DIAGNOSIS — R7301 Impaired fasting glucose: Secondary | ICD-10-CM

## 2021-12-13 MED ORDER — CLINDAMYCIN HCL 300 MG PO CAPS: 600.0000 mg | ORAL_CAPSULE | Freq: Two times a day (BID) | ORAL | 0 refills | Status: AC

## 2021-12-13 MED ORDER — PREDNISONE 10 MG PO TABS: ORAL_TABLET | ORAL | 0 refills | Status: DC

## 2021-12-13 MED ORDER — PREDNISONE 10 MG PO TABS: 20.0000 mg | ORAL_TABLET | Freq: Every day | ORAL | 0 refills | Status: DC

## 2021-12-13 NOTE — Assessment & Plan Note (Signed)
-  Given patient repeated reactions, will refer pt to see an allergist to know what is triggering these frequent episodes -Referral placed

## 2021-12-13 NOTE — Patient Instructions (Signed)
I appreciate the opportunity to provide care to you today!    Follow up:  1 months  Labs: please stop by the lab during the week to get your blood drawn (CBC, CMP, TSH, Lipid profile, HgA1c, Vit D)   Please pick up your medications at the pharmacy  Referrals today- Allergy   Please continue to a heart-healthy diet and increase your physical activities. Try to exercise for at least three times a week.      It was a pleasure to see you and I look forward to continuing to work together on your health and well-being. Please do not hesitate to call the office if you need care or have questions about your care.   Have a wonderful day and week. With Gratitude, [name] MSN, FNP-BC

## 2021-12-13 NOTE — Assessment & Plan Note (Signed)
-  rash of unknown etiology -will have pt complete steroids tamper dose and start antibiotics as she has some cracking of the skin, concern for possible underlying infection. -patient has several allergies, including penicillin, doxycycline, and sulfa drugs

## 2021-12-13 NOTE — Progress Notes (Signed)
New Patient Office Visit  Subjective:  Patient ID: Elizabeth Alvarez, female    DOB: [birth year]  Age: 36 y.o. MRN: [id number]  CC:  Chief Complaint  Patient presents with   New Patient (Initial Visit)    Will be establishing care, Pt c/o rash on face onset of this began on Sunday from eczema flare up and its been getting worse since then. Would like a referral to allergist to know what she is exactly allergic to.     HPI Elizabeth Alvarez is a 36 y.o. female who presents for establishing care. - she reports the onset of a slight rash at the skin folds of her left nostrils on 12/09/21 that has since spread to her mouth, nose, and chin. She reports cracks in her skin from itching and scratching at the rash. The rash is limited to her face and is erythematous. She notes no changes in detergents or recent use of chemicals on the face. She denies SOB, mouth or tongue swelling, wheezing, and coughing. She did apply the Kenalog lotion on her face at symptom onset. She has been using Benadryl, Tylenol, and NSAID with some relief. This is a recurrent finding, and she would like a referral to an allergist to know her allergies.    Past Medical History:  Diagnosis Date   Anemia    in the past   Eczema     Past Surgical History:  Procedure Laterality Date   LAPAROSCOPIC TUBAL LIGATION  03/29/2011   Procedure: LAPAROSCOPIC TUBAL LIGATION;  Surgeon: Roseanna Rainbow, MD;  Location: [facility];  Service: Gynecology;  Laterality: Bilateral;   NO PAST SURGERIES     WISDOM TOOTH EXTRACTION Bilateral    upper    Family History  Problem Relation Age of Onset   Diabetes Mother    Heart disease Mother    Heart disease Father     Social History   Socioeconomic History   Marital status: Single    Spouse name: Not on file   Number of children: Not on file   Years of education: Not on file   Highest education level: Not on file  Occupational History   Not on file  Tobacco Use    Smoking status: Light Smoker    Packs/day: 0.25    Types: Cigarettes   Smokeless tobacco: Never  Vaping Use   Vaping Use: Never used  Substance and Sexual Activity   Alcohol use: Yes    Comment: occasionally liquor   Drug use: No   Sexual activity: Yes    Partners: Male    Birth control/protection: Surgical  Other Topics Concern   Not on file  Social History Narrative   Not on file   Social Determinants of Health   Financial Resource Strain: Not on file  Food Insecurity: Not on file  Transportation Needs: Not on file  Physical Activity: Not on file  Stress: Not on file  Social Connections: Not on file  Intimate Partner Violence: Not on file    ROS Review of Systems  Constitutional:  Negative for chills, fatigue and fever.  HENT:  Negative for postnasal drip, rhinorrhea and sinus pain.   Eyes:  Negative for photophobia, pain and redness.  Respiratory:  Negative for chest tightness and shortness of breath.   Cardiovascular:  Negative for chest pain and palpitations.  Gastrointestinal:  Negative for diarrhea, nausea and vomiting.  Endocrine: Negative for polydipsia, polyphagia and polyuria.  Genitourinary:  Negative for frequency and urgency.  Musculoskeletal:  Negative for back pain and neck pain.  Skin:  Positive for rash.  Neurological:  Negative for seizures, numbness and headaches.  Psychiatric/Behavioral:  Negative for self-injury and suicidal ideas.     Objective:   Today's Vitals: BP 118/80   Pulse 97   Ht 5\' 6"  (1.676 m)   Wt 196 lb 9.6 oz (89.2 kg)   SpO2 96%   BMI 31.73 kg/m   Physical Exam HENT:     Right Ear: External ear normal.     Left Ear: External ear normal.     Nose: No congestion.     Mouth/Throat:     Mouth: Mucous membranes are moist.  Eyes:     Extraocular Movements: Extraocular movements intact.  Cardiovascular:     Rate and Rhythm: Normal rate and regular rhythm.     Pulses: Normal pulses.     Heart sounds: Normal heart sounds.   Pulmonary:     Effort: Pulmonary effort is normal.     Breath sounds: Normal breath sounds.  Abdominal:     Palpations: Abdomen is soft.  Musculoskeletal:     Right lower leg: No edema.     Left lower leg: No edema.  Skin:    Findings: Rash (No systemic rash seen on on patient, rash isolated to her nose,mouth and chin. no urticaria noted) present.  Neurological:     Mental Status: She is alert and oriented to person, place, and time.  Psychiatric:     Comments: Normal affect     Assessment & Plan:   Problem List Items Addressed This Visit       Musculoskeletal and Integument   Rash of face - Primary    -rash of unknown etiology -will have pt complete steroids tamper dose and start antibiotics as she has some cracking of the skin, concern for possible underlying infection. -patient has several allergies, including penicillin, doxycycline, and sulfa drugs        Other   Allergy    -Given patient repeated reactions, will refer pt to see an allergist to know what is triggering these frequent episodes -Referral placed      Other Visit Diagnoses     Rash       Relevant Medications   clindamycin (CLEOCIN) 300 MG capsule   predniSONE (DELTASONE) 10 MG tablet   Vitamin D deficiency       Relevant Orders   Vitamin D (25 hydroxy)   IFG (impaired fasting glucose)       Relevant Orders   Hemoglobin A1C   Anaphylaxis, subsequent encounter       Relevant Orders   Ambulatory referral to Allergy   Encounter for general adult medical examination with abnormal findings       Relevant Orders   CBC with Differential/Platelet   CMP14+EGFR   TSH + free T4   Lipid panel       Outpatient Encounter Medications as of 12/13/2021  Medication Sig   clindamycin (CLEOCIN) 300 MG capsule Take 2 capsules (600 mg total) by mouth in the morning and at bedtime for 7 days.   diphenhydrAMINE (BENADRYL) 25 mg capsule Take 1 capsule (25 mg total) by mouth every 4 (four) hours as needed for  itching or allergies.   EPINEPHrine 0.3 mg/0.3 mL IJ SOAJ injection Inject 0.3 mg into the muscle as needed for anaphylaxis.   benzonatate (TESSALON) 100 MG capsule Take 1 capsule (100 mg total) by mouth 3 (three) times daily as needed  for cough. (Patient not taking: Reported on 12/13/2021)   cetirizine (ZYRTEC ALLERGY) 10 MG tablet Take 1 tablet (10 mg total) by mouth daily. (Patient not taking: Reported on 12/13/2021)   famotidine (PEPCID) 20 MG tablet Take 1 tablet (20 mg total) by mouth 2 (two) times daily. (Patient not taking: Reported on 12/13/2021)   fluticasone (FLONASE) 50 MCG/ACT nasal spray Place 1 spray into both nostrils daily for 14 days. (Patient not taking: Reported on 10/07/2021)   hydrocortisone 2.5 % cream Apply topically 2 (two) times daily as needed (itching). (Patient not taking: Reported on 12/13/2021)   hydrOXYzine (ATARAX) 25 MG tablet Take 1 tablet (25 mg total) by mouth every 6 (six) hours as needed for itching. (Patient not taking: Reported on 12/13/2021)   ibuprofen (ADVIL,MOTRIN) 800 MG tablet TAKE 1 TABLET BY MOUTH EVERY 8 HOURS AS NEEDED (Patient not taking: Reported on 12/13/2021)   loratadine (CLARITIN) 10 MG tablet Take 1 tablet (10 mg total) by mouth daily. (Patient not taking: Reported on 12/13/2021)   metroNIDAZOLE (FLAGYL) 500 MG tablet Take 1 tablet (500 mg total) by mouth 2 (two) times daily. (Patient not taking: Reported on 12/13/2021)   naproxen sodium (ANAPROX) 220 MG tablet Take 220 mg by mouth 2 (two) times daily as needed. For pain (Patient not taking: Reported on 12/13/2021)   predniSONE (DELTASONE) 10 MG tablet Take 6 tabs by mouth daily  for 2 days, then 5 tabs for 2 days, then 4 tabs for 2 days, then 3 tabs for 2 days, 2 tabs for 2 days, then 1 tab by mouth daily for 2 days   PRENATAL 27-1 MG TABS TAKE 1 TABLET BY MOUTH BEFORE BREAKFAST (Patient not taking: Reported on 12/13/2021)   ranitidine (ZANTAC) 150 MG tablet Take 1 tablet (150 mg total) by mouth 2 (two)  times daily. x7 days (Patient not taking: Reported on 12/13/2021)   tinidazole (TINDAMAX) 500 MG tablet Take 4 tablets (2,000 mg total) by mouth daily with breakfast. (Patient not taking: Reported on 12/13/2021)   triamcinolone lotion (KENALOG) 0.1 % Apply 1 application. topically 2 (two) times daily. Apply to ears twice daily.  Avoid use around eyes. (Patient not taking: Reported on 12/13/2021)   [DISCONTINUED] predniSONE (DELTASONE) 10 MG tablet Take 2 tablets (20 mg total) by mouth daily. (Patient not taking: Reported on 12/13/2021)   [DISCONTINUED] predniSONE (DELTASONE) 10 MG tablet Take 6 tabs by mouth daily  for 2 days, then 5 tabs for 2 days, then 4 tabs for 2 days, then 3 tabs for 2 days, 2 tabs for 2 days, then 1 tab by mouth daily for 2 days (Patient not taking: Reported on 12/13/2021)   [DISCONTINUED] predniSONE (DELTASONE) 10 MG tablet Take 2 tablets (20 mg total) by mouth daily.   No facility-administered encounter medications on file as of 12/13/2021.    Follow-up: Return in about 1 month (around 01/12/2022) for pap smear.   [name], FNP

## 2021-12-14 ENCOUNTER — Other Ambulatory Visit: Payer: Self-pay | Admitting: Family Medicine

## 2021-12-14 ENCOUNTER — Telehealth: Payer: Self-pay | Admitting: Family Medicine

## 2021-12-14 DIAGNOSIS — J301 Allergic rhinitis due to pollen: Secondary | ICD-10-CM

## 2021-12-14 NOTE — Telephone Encounter (Signed)
Please inform her that her prescription for the steroid taper dose is ready to be picked up at the pharmacy. I spoke with the pharmacy tech. No charge is required.

## 2021-12-14 NOTE — Telephone Encounter (Signed)
Please ask if she picked up the taper dose with instructions to take 6 tab by mouth for 2 days, 5 tab for 2days, 4 tab for 2days, 3 tab for 2days , 2 tab for 2 days and 1tabs for 1 days

## 2021-12-14 NOTE — Telephone Encounter (Signed)
They only gave her a partial prescription.

## 2021-12-14 NOTE — Telephone Encounter (Signed)
Pt has been informed.

## 2021-12-14 NOTE — Telephone Encounter (Signed)
Pt called stating that the medication that she is currently on her insu will not cover because the dosage not increased. The was told to increase the dosage and is about to be out of the medication. Can you please increase the dosage so that her insu will cover this?  predniSONE (DELTASONE) 10 MG tablet   [facility]

## 2021-12-21 ENCOUNTER — Ambulatory Visit: Payer: Medicaid Other | Admitting: Family Medicine

## 2021-12-25 ENCOUNTER — Encounter (HOSPITAL_COMMUNITY): Payer: Self-pay | Admitting: Emergency Medicine

## 2021-12-25 ENCOUNTER — Emergency Department (HOSPITAL_COMMUNITY)
Admission: EM | Admit: 2021-12-25 | Discharge: 2021-12-25 | Disposition: A | Discharge status: Home / Self Care | Payer: Medicaid Other | Attending: Student | Admitting: Student

## 2021-12-25 ENCOUNTER — Other Ambulatory Visit: Payer: Self-pay

## 2021-12-25 DIAGNOSIS — L232 Allergic contact dermatitis due to cosmetics: Secondary | ICD-10-CM | POA: Diagnosis not present

## 2021-12-25 DIAGNOSIS — R21 Rash and other nonspecific skin eruption: Secondary | ICD-10-CM | POA: Diagnosis present

## 2021-12-25 MED ORDER — FAMOTIDINE IN NACL 20-0.9 MG/50ML-% IV SOLN
20.0000 mg | Freq: Once | INTRAVENOUS | Status: AC
  Administered 2021-12-25: 20 mg via INTRAVENOUS
  Filled 2021-12-25: qty 50

## 2021-12-25 MED ORDER — FAMOTIDINE 20 MG PO TABS
20.0000 mg | ORAL_TABLET | Freq: Two times a day (BID) | ORAL | 0 refills | Status: DC
Start: 2021-12-25 — End: 2022-04-01

## 2021-12-25 MED ORDER — DEXAMETHASONE SODIUM PHOSPHATE 10 MG/ML IJ SOLN
10.0000 mg | Freq: Once | INTRAMUSCULAR | Status: AC
  Administered 2021-12-25: 10 mg via INTRAVENOUS
  Filled 2021-12-25: qty 1

## 2021-12-25 MED ORDER — PREDNISONE 20 MG PO TABS: 40.0000 mg | ORAL_TABLET | Freq: Every day | ORAL | 0 refills | Status: DC

## 2021-12-25 MED ORDER — DIPHENHYDRAMINE HCL 25 MG PO CAPS
25.0000 mg | ORAL_CAPSULE | Freq: Once | ORAL | Status: AC
  Administered 2021-12-25: 25 mg via ORAL
  Filled 2021-12-25: qty 1

## 2021-12-25 NOTE — Discharge Instructions (Signed)
Start the prednisone prescription tomorrow.  Continue take over-the-counter Benadryl, 1 capsule every 6-8 hours.  Do not use alcohol on your face.  This is likely drying out your skin.  You may apply a small amount of over-the-counter 1% hydrocortisone cream to the affected areas, avoid using on your lips or eyes.  Use a moisturizer to your face twice daily.  Be sure to keep your upcoming appointment with the allergy provider.  Return to the emergency department for any new or worsening symptoms.

## 2021-12-25 NOTE — ED Provider Notes (Signed)
[facility] EMERGENCY DEPARTMENT Provider Note   CSN: [id number] Arrival date & time: 12/25/21  1558     History  Chief Complaint  Patient presents with   Allergic Reaction    Elizabeth Alvarez is a 36 y.o. female.   Allergic Reaction Presenting symptoms: rash   Presenting symptoms: no difficulty swallowing        Elizabeth Alvarez is a 36 y.o. female who presents to the Emergency Department complaining of itching and facial swelling for several months.  She states this has been waxing and waning.  Symptoms began after using hair dye back in April.  She had significant swelling of her face neck and ears with rash.  She was treated with steroids and Benadryl with resolution of her symptoms.  She again used a hair conditioning product approximately 1 month later and had similar symptoms.  She was treated again with steroids Benadryl and felt to have some cellulitis as well and was given prescription for clindamycin.  She returns today with itching and redness around her chin and lips.  She also has some redness around both eyes.  States she feels as though her skin is dry and cracked.  She has been cleaning her skin with rubbing alcohol.  She has an appointment to see an allergist next month.  She denies any difficulty swallowing, shortness of breath, swelling of her lips or tongue.  No nausea vomiting or chest pain.    Home Medications Prior to Admission medications   Medication Sig Start Date End Date Taking? Authorizing Provider  benzonatate (TESSALON) 100 MG capsule Take 1 capsule (100 mg total) by mouth 3 (three) times daily as needed for cough. Patient not taking: Reported on 12/13/2021 07/19/20   Durward Parcel, FNP  cetirizine (ZYRTEC ALLERGY) 10 MG tablet Take 1 tablet (10 mg total) by mouth daily. Patient not taking: Reported on 12/13/2021 07/19/20   Durward Parcel, FNP  diphenhydrAMINE (BENADRYL) 25 mg capsule Take 1 capsule (25 mg total) by mouth every 4 (four)  hours as needed for itching or allergies. 07/20/16   Elson Areas, PA-C  EPINEPHrine 0.3 mg/0.3 mL IJ SOAJ injection Inject 0.3 mg into the muscle as needed for anaphylaxis. 10/25/21   [name], PA-C  famotidine (PEPCID) 20 MG tablet Take 1 tablet (20 mg total) by mouth 2 (two) times daily. Patient not taking: Reported on 12/13/2021 10/07/21   Burgess Amor, PA-C  fluticasone [facility]) 50 MCG/ACT nasal spray Place 1 spray into both nostrils daily for 14 days. Patient not taking: Reported on 10/07/2021 07/19/20 08/02/20  Durward Parcel, FNP  hydrocortisone 2.5 % cream Apply topically 2 (two) times daily as needed (itching). Patient not taking: Reported on 12/13/2021 11/26/15   Street, [place], PA-C  hydrOXYzine (ATARAX) 25 MG tablet Take 1 tablet (25 mg total) by mouth every 6 (six) hours as needed for itching. Patient not taking: Reported on 12/13/2021 10/07/21   Burgess Amor, PA-C  ibuprofen (ADVIL,MOTRIN) 800 MG tablet TAKE 1 TABLET BY MOUTH EVERY 8 HOURS AS NEEDED Patient not taking: Reported on 12/13/2021 07/27/18   Brock Bad, MD  loratadine (CLARITIN) 10 MG tablet Take 1 tablet (10 mg total) by mouth daily. Patient not taking: Reported on 12/13/2021 11/22/16   Brock Bad, MD  metroNIDAZOLE (FLAGYL) 500 MG tablet Take 1 tablet (500 mg total) by mouth 2 (two) times daily. Patient not taking: Reported on 12/13/2021 11/26/16   Brock Bad, MD  naproxen sodium (  ANAPROX) 220 MG tablet Take 220 mg by mouth 2 (two) times daily as needed. For pain Patient not taking: Reported on 12/13/2021    [provider]  predniSONE (DELTASONE) 10 MG tablet Take 6 tabs by mouth daily  for 2 days, then 5 tabs for 2 days, then 4 tabs for 2 days, then 3 tabs for 2 days, 2 tabs for 2 days, then 1 tab by mouth daily for 2 days 12/13/21   [name], FNP  PRENATAL 27-1 MG TABS TAKE 1 TABLET BY MOUTH BEFORE BREAKFAST Patient not taking: Reported on 12/13/2021 05/04/17   Brock Bad, MD   ranitidine (ZANTAC) 150 MG tablet Take 1 tablet (150 mg total) by mouth 2 (two) times daily. x7 days Patient not taking: Reported on 12/13/2021 11/26/15   Street, [place], PA-C  tinidazole (TINDAMAX) 500 MG tablet Take 4 tablets (2,000 mg total) by mouth daily with breakfast. Patient not taking: Reported on 12/13/2021 08/05/17   Brock Bad, MD  triamcinolone lotion (KENALOG) 0.1 % Apply 1 application. topically 2 (two) times daily. Apply to ears twice daily.  Avoid use around eyes. Patient not taking: Reported on 12/13/2021 10/07/21   Burgess Amor, PA-C      Allergies    Aspirin, Doxycycline, Penicillins cross reactors, and Sulfa drugs cross reactors    Review of Systems   Review of Systems  Constitutional:  Negative for appetite change and fever.  HENT:  Negative for ear pain, sore throat and trouble swallowing.   Eyes:  Negative for pain and visual disturbance.  Respiratory:  Negative for chest tightness and shortness of breath.   Cardiovascular:  Negative for chest pain.  Gastrointestinal:  Negative for abdominal pain, nausea and vomiting.  Musculoskeletal:  Negative for arthralgias and neck pain.  Skin:  Positive for color change and rash.  Neurological:  Positive for headaches. Negative for dizziness, weakness and numbness.    Physical Exam Updated Vital Signs BP 134/85 (BP Location: Right Arm)   Pulse 93   Temp 97.9 F (36.6 C) (Oral)   Resp 18   Ht 5\' 6"  (1.676 m)   Wt 89 kg   LMP 11/21/2021   SpO2 100%   BMI 31.67 kg/m  Physical Exam Vitals and nursing note reviewed.  Constitutional:      General: She is not in acute distress.    Appearance: She is not ill-appearing or toxic-appearing.  HENT:     Head:     Comments: Periorbital erythema bilateral with mild edema. Erythema and chafing of the skin around the mouth.  No edema of the lips or tongue.  Uvula midline nonedematous.    Mouth/Throat:     Mouth: Mucous membranes are moist.     Pharynx: Oropharynx is  clear. Uvula midline. No pharyngeal swelling, oropharyngeal exudate, posterior oropharyngeal erythema or uvula swelling.  Eyes:     Extraocular Movements: Extraocular movements intact.     Conjunctiva/sclera: Conjunctivae normal.     Pupils: Pupils are equal, round, and reactive to light.  Cardiovascular:     Rate and Rhythm: Normal rate and regular rhythm.     Pulses: Normal pulses.  Pulmonary:     Effort: Pulmonary effort is normal.  Chest:     Chest wall: No tenderness.  Abdominal:     Palpations: Abdomen is soft.     Tenderness: There is no abdominal tenderness.  Musculoskeletal:        General: No swelling.     Cervical back: Normal  range of motion. No tenderness.     Right lower leg: No edema.     Left lower leg: No edema.  Lymphadenopathy:     Cervical: No cervical adenopathy.  Skin:    General: Skin is warm.     Capillary Refill: Capillary refill takes less than 2 seconds.     Findings: Rash present.  Neurological:     General: No focal deficit present.     Mental Status: She is alert.     Sensory: No sensory deficit.     Motor: No weakness.     ED Results / Procedures / Treatments   Labs (all labs ordered are listed, but only abnormal results are displayed) Labs Reviewed - No data to display  EKG None  Radiology No results found.  Procedures Procedures    Medications Ordered in ED Medications  dexamethasone (DECADRON) injection 10 mg (has no administration in time range)  diphenhydrAMINE (BENADRYL) capsule 25 mg (has no administration in time range)  famotidine (PEPCID) IVPB 20 mg premix (has no administration in time range)    ED Course/ Medical Decision Making/ A&P                           Medical Decision Making Patient here for recurrent allergic symptoms, localized to the face.  Symptoms initially began in April after using a hair coloring product.  1 month later, she used a hair conditioner and had similar reaction.  Both times she improved  after steroids and Benadryl.  She was continuing to take Benadryl up until yesterday and symptoms worsened.  Denies any shortness of breath, difficulty swallowing, swelling of her lips or tongue.  On exam, she has some periorbital erythema and mild edema bilaterally.  There is erythema and chafing of the skin around her mouth.  No edema of the lips or tongue.  No respiratory distress noted.  Differential would include cellulitis, allergic reaction, no concerning symptoms for anaphylaxis or angioedema.  Amount and/or Complexity of Data Reviewed Discussion of management or test interpretation with external provider(s): On recheck, patient has received Benadryl, Pepcid and IV Decadron.  Reports feeling better, erythema of the face has improved.  No airway compromise.  Recommended that she use moisturizer to her face and avoid using any alcohol.  She will keep scheduled appointment to see allergist.  She appears appropriate for discharge home we will start her back on a short course of steroids and she will continue Benadryl. Return precautions discussed  Risk Prescription drug management.           Final Clinical Impression(s) / ED Diagnoses Final diagnoses:  Allergic contact dermatitis due to cosmetics    Rx / DC Orders ED Discharge Orders     None         Pauline Aus, PA-C 12/26/21 0917    Glendora Score, MD 12/28/21 1142

## 2021-12-25 NOTE — ED Triage Notes (Signed)
Pt has allergy to hair dye that has periodically caused facial swelling primarily around eyes x 1 month. Pt states that today her chin feels dry and skin is cracking and draining. Pt is waiting to get in with allergist. No questions or concerns.

## 2021-12-28 LAB — CBC WITH DIFFERENTIAL/PLATELET
Basophils Absolute: 0.1 10*3/uL (ref 0.0–0.2)
Basos: 1 %
EOS (ABSOLUTE): 0.1 10*3/uL (ref 0.0–0.4)
Eos: 1 %
Hematocrit: 41.8 % (ref 34.0–46.6)
Hemoglobin: 13.8 g/dL (ref 11.1–15.9)
Immature Grans (Abs): 0.1 10*3/uL (ref 0.0–0.1)
Immature Granulocytes: 1 %
Lymphocytes Absolute: 4.1 10*3/uL — ABNORMAL HIGH (ref 0.7–3.1)
Lymphs: 37 %
MCH: 32.9 pg (ref 26.6–33.0)
MCHC: 33 g/dL (ref 31.5–35.7)
MCV: 100 fL — ABNORMAL HIGH (ref 79–97)
Monocytes Absolute: 0.5 10*3/uL (ref 0.1–0.9)
Monocytes: 5 %
Neutrophils Absolute: 6.1 10*3/uL (ref 1.4–7.0)
Neutrophils: 55 %
Platelets: 308 10*3/uL (ref 150–450)
RBC: 4.2 x10E6/uL (ref 3.77–5.28)
RDW: 12.5 % (ref 11.7–15.4)
WBC: 10.9 10*3/uL — ABNORMAL HIGH (ref 3.4–10.8)

## 2021-12-28 LAB — CMP14+EGFR
ALT: 14 IU/L (ref 0–32)
AST: 11 IU/L (ref 0–40)
Albumin/Globulin Ratio: 1.9 (ref 1.2–2.2)
Albumin: 4.2 g/dL (ref 3.9–4.9)
Alkaline Phosphatase: 59 IU/L (ref 44–121)
BUN/Creatinine Ratio: 22 (ref 9–23)
BUN: 18 mg/dL (ref 6–20)
Bilirubin Total: <0.2 mg/dL (ref 0.0–1.2)
CO2: 25 mmol/L (ref 20–29)
Calcium: 9.3 mg/dL (ref 8.7–10.2)
Chloride: 104 mmol/L (ref 96–106)
Creatinine, Ser: 0.83 mg/dL (ref 0.57–1.00)
Globulin, Total: 2.2 g/dL (ref 1.5–4.5)
Glucose: 81 mg/dL (ref 70–99)
Potassium: 4.9 mmol/L (ref 3.5–5.2)
Sodium: 141 mmol/L (ref 134–144)
Total Protein: 6.4 g/dL (ref 6.0–8.5)
eGFR: 92 mL/min/{1.73_m2} (ref >59)

## 2021-12-28 LAB — LIPID PANEL
Chol/HDL Ratio: 3.3 ratio (ref 0.0–4.4)
Cholesterol, Total: 241 mg/dL — ABNORMAL HIGH (ref 100–199)
HDL: 72 mg/dL (ref >39)
LDL Chol Calc (NIH): 148 mg/dL — ABNORMAL HIGH (ref 0–99)
Triglycerides: 119 mg/dL (ref 0–149)
VLDL Cholesterol Cal: 21 mg/dL (ref 5–40)

## 2021-12-28 LAB — VITAMIN D 25 HYDROXY (VIT D DEFICIENCY, FRACTURES): Vit D, 25-Hydroxy: 13.8 ng/mL — ABNORMAL LOW (ref 30.0–100.0)

## 2021-12-28 LAB — HEMOGLOBIN A1C
Est. average glucose Bld gHb Est-mCnc: 108 mg/dL
Hgb A1c MFr Bld: 5.4 % (ref 4.8–5.6)

## 2021-12-28 LAB — TSH+FREE T4
Free T4: 1.35 ng/dL (ref 0.82–1.77)
TSH: 1.63 u[IU]/mL (ref 0.450–4.500)

## 2021-12-30 ENCOUNTER — Other Ambulatory Visit: Payer: Self-pay | Admitting: Family Medicine

## 2021-12-30 MED ORDER — VITAMIN D (ERGOCALCIFEROL) 1.25 MG (50000 UNIT) PO CAPS: 50000.0000 [IU] | ORAL_CAPSULE | ORAL | 1 refills | Status: DC

## 2021-12-30 NOTE — Progress Notes (Unsigned)
The ASCVD Risk score (Arnett DK, et al., 2019) failed to calculate for the following reasons:    The 2019 ASCVD risk score is only valid for ages 40 to 79

## 2021-12-30 NOTE — Progress Notes (Signed)
Please inform the patient that her cholesterol is elevated. I recommend lifestyle changes with low carbs and fat diet. I will reassess her labs at her next visit.   Her vit D is low; a prescription has been sent to her pharmacy for a once-weekly supplement.

## 2022-01-03 ENCOUNTER — Ambulatory Visit: Payer: Medicaid Other | Admitting: Family Medicine

## 2022-01-04 ENCOUNTER — Encounter: Payer: Self-pay | Admitting: Family Medicine

## 2022-01-11 ENCOUNTER — Ambulatory Visit: Payer: Medicaid Other | Admitting: Family Medicine

## 2022-01-16 ENCOUNTER — Encounter: Payer: Self-pay | Admitting: Family Medicine

## 2022-01-16 ENCOUNTER — Other Ambulatory Visit (HOSPITAL_COMMUNITY)
Admission: RE | Admit: 2022-01-16 | Discharge: 2022-01-16 | Disposition: A | Discharge status: Home / Self Care | Payer: Medicaid Other | Source: Ambulatory Visit | Attending: Family Medicine | Admitting: Family Medicine

## 2022-01-16 ENCOUNTER — Ambulatory Visit (INDEPENDENT_AMBULATORY_CARE_PROVIDER_SITE_OTHER): Payer: Medicaid Other | Admitting: Family Medicine

## 2022-01-16 VITALS — BP 134/72 | HR 95 | Ht 66.0 in | Wt 200.1 lb

## 2022-01-16 DIAGNOSIS — Z124 Encounter for screening for malignant neoplasm of cervix: Secondary | ICD-10-CM | POA: Diagnosis present

## 2022-01-16 DIAGNOSIS — Z23 Encounter for immunization: Secondary | ICD-10-CM

## 2022-01-16 DIAGNOSIS — N944 Primary dysmenorrhea: Secondary | ICD-10-CM | POA: Diagnosis not present

## 2022-01-16 MED ORDER — IBUPROFEN 800 MG PO TABS: 800.0000 mg | ORAL_TABLET | Freq: Three times a day (TID) | ORAL | 2 refills | Status: DC | PRN

## 2022-01-16 NOTE — Progress Notes (Signed)
Established Patient Office Visit  Subjective:  Patient ID: Elizabeth Alvarez, female    DOB: 1986/03/28  Age: 36 y.o. MRN: [id number]  CC:  Chief Complaint  Patient presents with   Follow-up    Pt following up, rash has resolved. Has been taking vitamin d. Pt has cut out fried and greasy food.     HPI Elizabeth Alvarez is a 35 y.o. female presents for a pap smear.  She received her Tdap vaccine today.   Past Medical History:  Diagnosis Date   Anemia    in the past   Eczema     Past Surgical History:  Procedure Laterality Date   LAPAROSCOPIC TUBAL LIGATION  03/29/2011   Procedure: LAPAROSCOPIC TUBAL LIGATION;  Surgeon: Roseanna Rainbow, MD;  Location: [facility];  Service: Gynecology;  Laterality: Bilateral;   NO PAST SURGERIES     WISDOM TOOTH EXTRACTION Bilateral    upper    Family History  Problem Relation Age of Onset   Diabetes Mother    Heart disease Mother    Heart disease Father     Social History   Socioeconomic History   Marital status: Single    Spouse name: Not on file   Number of children: Not on file   Years of education: Not on file   Highest education level: Not on file  Occupational History   Not on file  Tobacco Use   Smoking status: Light Smoker    Packs/day: 0.25    Types: Cigarettes   Smokeless tobacco: Never  Vaping Use   Vaping Use: Never used  Substance and Sexual Activity   Alcohol use: Yes    Comment: occasionally liquor   Drug use: No   Sexual activity: Yes    Partners: Male    Birth control/protection: Surgical  Other Topics Concern   Not on file  Social History Narrative   Not on file   Social Determinants of Health   Financial Resource Strain: Not on file  Food Insecurity: Not on file  Transportation Needs: Not on file  Physical Activity: Not on file  Stress: Not on file  Social Connections: Not on file  Intimate Partner Violence: Not on file    Outpatient Medications Prior to Visit  Medication Sig  Dispense Refill   EPINEPHrine 0.3 mg/0.3 mL IJ SOAJ injection Inject 0.3 mg into the muscle as needed for anaphylaxis. 1 each 0   famotidine (PEPCID) 20 MG tablet Take 1 tablet (20 mg total) by mouth 2 (two) times daily. 14 tablet 0   triamcinolone lotion (KENALOG) 0.1 % Apply 1 application. topically 2 (two) times daily. Apply to ears twice daily.  Avoid use around eyes. 30 mL 0   Vitamin D, Ergocalciferol, (DRISDOL) 1.25 MG (50000 UNIT) CAPS capsule Take 1 capsule (50,000 Units total) by mouth every 7 (seven) days. 5 capsule 1   ibuprofen (ADVIL,MOTRIN) 800 MG tablet TAKE 1 TABLET BY MOUTH EVERY 8 HOURS AS NEEDED 30 tablet 5   benzonatate (TESSALON) 100 MG capsule Take 1 capsule (100 mg total) by mouth 3 (three) times daily as needed for cough. (Patient not taking: Reported on 01/16/2022) 30 capsule 0   diphenhydrAMINE (BENADRYL) 25 mg capsule Take 1 capsule (25 mg total) by mouth every 4 (four) hours as needed for itching or allergies. (Patient not taking: Reported on 01/16/2022) 30 capsule 0   fluticasone (FLONASE) 50 MCG/ACT nasal spray Place 1 spray into both nostrils daily for 14 days. (Patient not  taking: Reported on 10/07/2021) 16 g 0   metroNIDAZOLE (FLAGYL) 500 MG tablet Take 1 tablet (500 mg total) by mouth 2 (two) times daily. (Patient not taking: Reported on 01/16/2022) 14 tablet 2   predniSONE (DELTASONE) 20 MG tablet Take 2 tablets (40 mg total) by mouth daily. (Patient not taking: Reported on 01/16/2022) 8 tablet 0   PRENATAL 27-1 MG TABS TAKE 1 TABLET BY MOUTH BEFORE BREAKFAST (Patient not taking: Reported on 01/16/2022) 30 tablet 11   ranitidine (ZANTAC) 150 MG tablet Take 1 tablet (150 mg total) by mouth 2 (two) times daily. x7 days (Patient not taking: Reported on 01/16/2022) 14 tablet 0   tinidazole (TINDAMAX) 500 MG tablet Take 4 tablets (2,000 mg total) by mouth daily with breakfast. (Patient not taking: Reported on 01/16/2022) 8 tablet 0   naproxen sodium (ANAPROX) 220 MG tablet Take 220 mg  by mouth 2 (two) times daily as needed. For pain (Patient not taking: Reported on 01/16/2022)     No facility-administered medications prior to visit.    Allergies  Allergen Reactions   Aspirin Nausea And Vomiting   Doxycycline Hives, Itching and Swelling   Penicillins Cross Reactors Hives, Itching and Swelling    Has patient had a PCN reaction causing immediate rash, facial/tongue/throat swelling, SOB or lightheadedness with hypotension:yes Has patient had a PCN reaction causing severe rash involving mucus membranes or skin necrosis: no Has patient had a PCN reaction that required hospitalization- yes Has patient had a PCN reaction occurring within the last 10 years: yes, 7-8 yrs ago If all of the above answers are "NO", then may proceed with Cephalosporin use.    Sulfa Drugs Cross Reactors Hives, Itching and Swelling    ROS Review of Systems  Constitutional:  Negative for fatigue and fever.  Genitourinary:  Negative for hematuria, menstrual problem, pelvic pain, vaginal bleeding, vaginal discharge and vaginal pain.  Psychiatric/Behavioral:  Negative for self-injury and suicidal ideas.       Objective:    Physical Exam Cardiovascular:     Rate and Rhythm: Normal rate and regular rhythm.     Pulses: Normal pulses.     Heart sounds: Normal heart sounds.  Pulmonary:     Effort: Pulmonary effort is normal.     Breath sounds: Normal breath sounds.  Abdominal:     Palpations: Abdomen is soft.  Genitourinary:    General: Normal vulva.     Exam position: Lithotomy position.     Tanner stage (genital): 5.     Labia:        Right: No rash, tenderness, lesion or injury.        Left: No rash, tenderness, lesion or injury.      Comments: Vaginal wall: pink and rugated, smooth and non-tender; absence of lesions, edema, and erythema. Labia Majora and Minora: present bilaterally, moist, soft tissue, and homogeneous; free of edema and ulcerations. Clitoris is anatomically present, above  the urethral, and free of lesions, masses, and ulceration.   Neurological:     Mental Status: She is alert.     BP 134/72   Pulse 95   Ht 5\' 6"  (1.676 m)   Wt 200 lb 1.3 oz (90.8 kg)   LMP 11/21/2021   SpO2 98%   BMI 32.29 kg/m  Wt Readings from Last 3 Encounters:  01/16/22 200 lb 1.3 oz (90.8 kg)  12/25/21 196 lb 3.4 oz (89 kg)  12/13/21 196 lb 9.6 oz (89.2 kg)    Lab Results  Component Value Date   TSH 1.630 12/27/2021   Lab Results  Component Value Date   WBC 10.9 (H) 12/27/2021   HGB 13.8 12/27/2021   HCT 41.8 12/27/2021   MCV 100 (H) 12/27/2021   PLT 308 12/27/2021   Lab Results  Component Value Date   NA 141 12/27/2021   K 4.9 12/27/2021   CO2 25 12/27/2021   GLUCOSE 81 12/27/2021   BUN 18 12/27/2021   CREATININE 0.83 12/27/2021   BILITOT <0.2 12/27/2021   ALKPHOS 59 12/27/2021   AST 11 12/27/2021   ALT 14 12/27/2021   PROT 6.4 12/27/2021   ALBUMIN 4.2 12/27/2021   CALCIUM 9.3 12/27/2021   EGFR 92 12/27/2021   Lab Results  Component Value Date   CHOL 241 (H) 12/27/2021   Lab Results  Component Value Date   HDL 72 12/27/2021   Lab Results  Component Value Date   LDLCALC 148 (H) 12/27/2021   Lab Results  Component Value Date   TRIG 119 12/27/2021   Lab Results  Component Value Date   CHOLHDL 3.3 12/27/2021   Lab Results  Component Value Date   HGBA1C 5.4 12/27/2021      Assessment & Plan:   Problem List Items Addressed This Visit       Other   Cervical cancer screening - Primary    - According to the American Cancer Society, cytology and HPV co-testing (preferred) every 5 years or cytology alone (acceptable) every 3 years. - Pap due 2026       Relevant Orders   Cytology - PAP   Other Visit Diagnoses     Primary dysmenorrhea       Relevant Medications   ibuprofen (ADVIL) 800 MG tablet   Immunization due       Relevant Orders   Tdap vaccine greater than or equal to 7yo IM (Completed)       Meds ordered this  encounter  Medications   ibuprofen (ADVIL) 800 MG tablet    Sig: Take 1 tablet (800 mg total) by mouth every 8 (eight) hours as needed.    Dispense:  30 tablet    Refill:  2    Follow-up: Return in about 4 months (around 05/18/2022) for CPE.    [name], FNP

## 2022-01-16 NOTE — Assessment & Plan Note (Signed)
-   According to the American Cancer Society, cytology and HPV co-testing (preferred) every 5 years or cytology alone (acceptable) every 3 years. - Pap due 2026  

## 2022-01-16 NOTE — Patient Instructions (Signed)
I appreciate the opportunity to provide care to you today!    Follow up:  4 months CPE    Please continue to a heart-healthy diet and increase your physical activities. Try to exercise for 30mins at least three times a week.      It was a pleasure to see you and I look forward to continuing to work together on your health and well-being. Please do not hesitate to call the office if you need care or have questions about your care.   Have a wonderful day and week. With Gratitude, Alanda Colton MSN, FNP-BC  

## 2022-01-21 ENCOUNTER — Other Ambulatory Visit: Payer: Self-pay | Admitting: Family Medicine

## 2022-01-21 DIAGNOSIS — Z124 Encounter for screening for malignant neoplasm of cervix: Secondary | ICD-10-CM

## 2022-01-21 LAB — CYTOLOGY - PAP
Comment: NEGATIVE
Diagnosis: UNDETERMINED — AB
High risk HPV: NEGATIVE

## 2022-01-21 NOTE — Progress Notes (Signed)
Please inform the patient to return for pap in 1 year. Her pap screening results shows ASC-US with negative HPV

## 2022-03-05 ENCOUNTER — Encounter: Payer: Self-pay | Admitting: Emergency Medicine

## 2022-03-05 ENCOUNTER — Other Ambulatory Visit: Payer: Self-pay

## 2022-03-05 ENCOUNTER — Ambulatory Visit
Admission: EM | Admit: 2022-03-05 | Discharge: 2022-03-05 | Disposition: A | Discharge status: Home / Self Care | Payer: Medicaid Other | Attending: Nurse Practitioner | Admitting: Nurse Practitioner

## 2022-03-05 DIAGNOSIS — S61215A Laceration without foreign body of left ring finger without damage to nail, initial encounter: Secondary | ICD-10-CM

## 2022-03-05 MED ORDER — CLINDAMYCIN HCL 150 MG PO CAPS
450.0000 mg | ORAL_CAPSULE | Freq: Three times a day (TID) | ORAL | 0 refills | Status: AC
Start: 2022-03-05 — End: 2022-03-12

## 2022-03-05 NOTE — Discharge Instructions (Addendum)
Please wear the dressing we put on your finger today for the first 24 hours.  After 24 hours, you may shower like normal.  Please continue to keep the area covered with triple antibiotic ointment and a bandage.  Please start on the oral antibiotic to help prevent infection.  Come back in 10 to 14 days to have the stitches taken out.  If you develop signs or symptoms of infection before that time, come back to see [place] sooner.

## 2022-03-05 NOTE — ED Triage Notes (Addendum)
Pt reports laceration to left ring finger while at work today. Site assessed prior to triage. Bleeding controlled.  V-shaped laceration noted. Site wrapped with telfa and coban. Pt tolerated well.

## 2022-03-05 NOTE — ED Notes (Signed)
Site soaking in hibiclense and sterile water.pt reports burning sensation but tolerating fairly well.   Pt reports last tetanus a few weeks ago.

## 2022-03-05 NOTE — ED Notes (Signed)
Left hand cleaned with wound cleanser post suture placement per NP.   Ointment applied to site, telfa, finger splint, and secured with coban. Pt tolerated well. Site management and infection prevention education provided reviewed. Pt verbalized understanding.

## 2022-03-05 NOTE — ED Provider Notes (Signed)
[facility]    CSN: [id number] Arrival date & time: 03/05/22  1320      History   Chief Complaint Chief Complaint  Patient presents with   Laceration    HPI Elizabeth Alvarez is a 36 y.o. female.   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  Reports the area is tender to touch and feels a little bit numb in the tip.  She denies decreased range of motion of the digit.  Reports bleeding is controlled.  Last tetanus shot earlier this year.    Past Medical History:  Diagnosis Date   Anemia    in the past   Eczema     Patient Active Problem List   Diagnosis Date Noted   Cervical cancer screening 01/16/2022   Rash of face 12/13/2021   Allergy 12/13/2021   Contraceptive management 03/29/2011   Pregnancy, supervision of normal 01/23/2011   SROM (spontaneous rupture of membranes) 01/23/2011    Past Surgical History:  Procedure Laterality Date   LAPAROSCOPIC TUBAL LIGATION  03/29/2011   Procedure: LAPAROSCOPIC TUBAL LIGATION;  Surgeon: Roseanna RainbowLisa A Jackson-Moore, MD;  Location: [facility];  Service: Gynecology;  Laterality: Bilateral;   NO PAST SURGERIES     WISDOM TOOTH EXTRACTION Bilateral    upper    OB History     Gravida  4   Para  2   Term  2   Preterm      AB  2   Living  2      SAB  1   IAB  1   Ectopic      Multiple      Live Births  2            Home Medications    Prior to Admission medications   Medication Sig Start Date End Date Taking? Authorizing Provider  clindamycin (CLEOCIN) 150 MG capsule Take 3 capsules (450 mg total) by mouth 3 (three) times daily for 7 days. 03/05/22 03/12/22 Yes Valentino NoseMartinez, Kameka Whan A, NP  benzonatate (TESSALON) 100 MG capsule Take 1 capsule (100 mg total) by mouth 3 (three) times daily as needed for cough. Patient not taking: Reported on 01/16/2022 07/19/20   Durward ParcelAvegno, Komlanvi S, FNP  diphenhydrAMINE (BENADRYL) 25 mg  capsule Take 1 capsule (25 mg total) by mouth every 4 (four) hours as needed for itching or allergies. Patient not taking: Reported on 01/16/2022 07/20/16   Elson AreasSofia, Leslie K, PA-C  EPINEPHrine 0.3 mg/0.3 mL IJ SOAJ injection Inject 0.3 mg into the muscle as needed for anaphylaxis. 10/25/21   Carroll SageFaulkner, William J, PA-C  famotidine (PEPCID) 20 MG tablet Take 1 tablet (20 mg total) by mouth 2 (two) times daily. 12/25/21   Triplett, Tammy, PA-C  fluticasone (FLONASE) 50 MCG/ACT nasal spray Place 1 spray into both nostrils daily for 14 days. Patient not taking: Reported on 10/07/2021 07/19/20 08/02/20  Durward ParcelAvegno, Komlanvi S, FNP  ibuprofen (ADVIL) 800 MG tablet Take 1 tablet (800 mg total) by mouth every 8 (eight) hours as needed. 01/16/22   Gilmore LarocheZarwolo, Gloria, FNP  predniSONE (DELTASONE) 20 MG tablet Take 2 tablets (40 mg total) by mouth daily. Patient not taking: Reported on 01/16/2022 12/25/21   Triplett, Babette Relicammy, PA-C  PRENATAL 27-1 MG TABS TAKE 1 TABLET BY MOUTH BEFORE BREAKFAST Patient not taking: Reported on 01/16/2022 05/04/17   Brock BadHarper, Charles A, MD  ranitidine (ZANTAC) 150 MG tablet Take 1 tablet (150  mg total) by mouth 2 (two) times daily. x7 days Patient not taking: Reported on 01/16/2022 11/26/15   Street, [place], PA-C  triamcinolone lotion (KENALOG) 0.1 % Apply 1 application. topically 2 (two) times daily. Apply to ears twice daily.  Avoid use around eyes. 10/07/21   Burgess Amor, PA-C  Vitamin D, Ergocalciferol, (DRISDOL) 1.25 MG (50000 UNIT) CAPS capsule Take 1 capsule (50,000 Units total) by mouth every 7 (seven) days. 12/30/21   [name], FNP    Family History Family History  Problem Relation Age of Onset   Diabetes Mother    Heart disease Mother    Heart disease Father     Social History Social History   Tobacco Use   Smoking status: Light Smoker    Packs/day: 0.25    Types: Cigarettes   Smokeless tobacco: Never  Vaping Use   Vaping Use: Never used  Substance Use Topics   Alcohol use: Yes     Comment: occasionally liquor   Drug use: No     Allergies   Aspirin, Doxycycline, Penicillins cross reactors, and Sulfa drugs cross reactors   Review of Systems Review of Systems Per HPI  Physical Exam Triage Vital Signs ED Triage Vitals  Enc Vitals Group     BP 03/05/22 1430 (!) 148/87     Pulse Rate 03/05/22 1430 62     Resp 03/05/22 1430 20     Temp 03/05/22 1430 98.2 F (36.8 C)     Temp Source 03/05/22 1430 Oral     SpO2 03/05/22 1430 98 %     Weight --      Height --      Head Circumference --      Peak Flow --      Pain Score 03/05/22 1425 4     Pain Loc --      Pain Edu? --      Excl. in GC? --    No data found.  Updated Vital Signs BP (!) 148/87 (BP Location: Right Arm)   Pulse 62   Temp 98.2 F (36.8 C) (Oral)   Resp 20   LMP 02/04/2022 (Approximate)   SpO2 98%   Visual Acuity Right Eye Distance:   Left Eye Distance:   Bilateral Distance:    Right Eye Near:   Left Eye Near:    Bilateral Near:     Physical Exam Vitals reviewed.  Constitutional:      General: She is not in acute distress.    Appearance: Normal appearance. She is not toxic-appearing.  HENT:     Head: Normocephalic and atraumatic.     Mouth/Throat:     Mouth: Mucous membranes are moist.     Pharynx: Oropharynx is clear.  Pulmonary:     Effort: Pulmonary effort is normal. No respiratory distress.  Musculoskeletal:       Hands:     Comments: Approximately 2.5 cm U-shaped laceration to distal left fourth digit in approximately area marked.  Skin:    General: Skin is warm and dry.     Capillary Refill: Capillary refill takes less than 2 seconds.     Findings: Laceration present. No erythema or rash.  Neurological:     Mental Status: She is alert and oriented to person, place, and time.  Psychiatric:        Behavior: Behavior is cooperative.      UC Treatments / Results  Labs (all labs ordered are listed, but only abnormal results are displayed) Labs  Reviewed -  No data to display  EKG   Radiology No results found.  Procedures Laceration Repair  Date/Time: 03/05/2022 4:29 PM  Performed by: Valentino Nose, NP Authorized by: Valentino Nose, NP   Consent:    Consent obtained:  Verbal   Consent given by:  Patient   Risks, benefits, and alternatives were discussed: yes     Risks discussed:  Infection, pain and poor cosmetic result   Alternatives discussed:  Delayed treatment and observation Universal protocol:    Procedure explained and questions answered to patient or proxy's satisfaction: yes     Patient identity confirmed:  Verbally with patient Anesthesia:    Anesthesia method:  Local infiltration   Local anesthetic:  Lidocaine 2% w/o epi Laceration details:    Location:  Finger   Finger location:  L ring finger   Length (cm):  2.5 Pre-procedure details:    Preparation:  Patient was prepped and draped in usual sterile fashion Exploration:    Hemostasis achieved with:  Direct pressure Treatment:    Area cleansed with:  Chlorhexidine   Amount of cleaning:  Standard   Irrigation solution:  Sterile saline Skin repair:    Repair method:  Sutures   Suture size:  5-0   Suture material:  Prolene   Suture technique:  Simple interrupted   Number of sutures:  6 Approximation:    Approximation:  Loose Repair type:    Repair type:  Simple Post-procedure details:    Dressing:  Non-adherent dressing, splint for protection and antibiotic ointment   Procedure completion:  Tolerated well, no immediate complications  (including critical care time)  Medications Ordered in UC Medications - No data to display  Initial Impression / Assessment and Plan / UC Course  I have reviewed the triage vital signs and the nursing notes.  Pertinent labs & imaging results that were available during my care of the patient were reviewed by me and considered in my medical decision making (see chart for details).    Patient is well-appearing,  afebrile, not tachycardic, not tachypneic, oxygenating well on room air.  She is slightly hypertensive, likely secondary to pain from laceration.  Laceration repair as above.  Wound care discussed.  Start clindamycin for wound prophylaxis.  Tdap is up-to-date.  Return precautions discussed.  Return in 10 to 14 days for suture removal.  Note given for work.  The patient was given the opportunity to ask questions.  All questions answered to their satisfaction.  The patient is in agreement to this plan.   Final Clinical Impressions(s) / UC Diagnoses   Final diagnoses:  Laceration of left ring finger without foreign body without damage to nail, initial encounter     Discharge Instructions      Please wear the dressing we put on your finger today for the first 24 hours.  After 24 hours, you may shower like normal.  Please continue to keep the area covered with triple antibiotic ointment and a bandage.  Please start on the oral antibiotic to help prevent infection.  Come back in 10 to 14 days to have the stitches taken out.  If you develop signs or symptoms of infection before that time, come back to see [place] sooner.    ED Prescriptions     Medication Sig Dispense Auth. Provider   clindamycin (CLEOCIN) 150 MG capsule Take 3 capsules (450 mg total) by mouth 3 (three) times daily for 7 days. 63 capsule Valentino Nose, NP  PDMP not reviewed this encounter.   Eulogio Bear, NP 03/05/22 1651

## 2022-03-15 ENCOUNTER — Ambulatory Visit
Admission: EM | Admit: 2022-03-15 | Discharge: 2022-03-15 | Disposition: A | Discharge status: Home / Self Care | Payer: Medicaid Other

## 2022-03-15 ENCOUNTER — Encounter: Payer: Self-pay | Admitting: Emergency Medicine

## 2022-03-15 NOTE — ED Triage Notes (Signed)
Here for suture removal of stitches to left ring finger

## 2022-03-15 NOTE — ED Notes (Signed)
6 sutures removed from left middle finger.  Non stick dressing applied.

## 2022-04-01 ENCOUNTER — Ambulatory Visit (INDEPENDENT_AMBULATORY_CARE_PROVIDER_SITE_OTHER): Payer: Medicaid Other | Admitting: Internal Medicine

## 2022-04-01 ENCOUNTER — Encounter: Payer: Self-pay | Admitting: Internal Medicine

## 2022-04-01 VITALS — BP 112/80 | HR 83 | Temp 98.5°F | Resp 16 | Ht 67.8 in | Wt 204.4 lb

## 2022-04-01 DIAGNOSIS — J310 Chronic rhinitis: Secondary | ICD-10-CM | POA: Diagnosis not present

## 2022-04-01 DIAGNOSIS — L235 Allergic contact dermatitis due to other chemical products: Secondary | ICD-10-CM

## 2022-04-01 DIAGNOSIS — H1013 Acute atopic conjunctivitis, bilateral: Secondary | ICD-10-CM

## 2022-04-01 MED ORDER — HYDROCORTISONE 2.5 % EX OINT: TOPICAL_OINTMENT | CUTANEOUS | 3 refills | Status: AC

## 2022-04-01 MED ORDER — FLUTICASONE PROPIONATE 50 MCG/ACT NA SUSP: 2.0000 | Freq: Every day | NASAL | 3 refills | Status: AC

## 2022-04-01 MED ORDER — CETIRIZINE HCL 10 MG PO TABS: 10.0000 mg | ORAL_TABLET | Freq: Every day | ORAL | 3 refills | Status: AC

## 2022-04-01 NOTE — Patient Instructions (Addendum)
-   Avoid products with fragrances.   - For acute flare ups, try hydrocortisone 2.5% daily.  Avoid using it for more than 7 days at a time.  - Recommend doing patch testing to help identify products that might be causing your reaction.   Rhinitis: - Use nasal saline rinses before nose sprays such as with Neilmed Sinus Rinse.  Use distilled water.   - Use Flonase 2 sprays each nostril daily. Aim upward and outward. - Use Zyrtec 10 mg daily as needed.     Appointment: Patch testing whenever able  And follow up in 2 months for regular visit.

## 2022-04-01 NOTE — Progress Notes (Signed)
NEW PATIENT  Date of Service/Encounter:  04/01/22  Consult requested by: [name], FNP   Subjective:   Elizabeth Alvarez (DOB: 1986-05-04) is a 36 y.o. female who presents to the clinic on 04/01/2022 with a chief complaint of Allergic Reaction (Says it was from hair dye. Says her face, neck, ears broke out and she had severe swelling. Has pictures. ) and Eczema (Says she doesn't have issues at this time but she has flare ups at times and itchiness. )  History obtained from: chart review and patient.  Rash: Reports in early April, she used a hair dye, Clairol and she developed a red, itchy, bumpy rash with some facial swelling about 24-48 hours later.  She took benadryl but it did not resolve so she went to the ER.  She was given IM decadron and prescribed pepcid, prednisone, triamcinolone and atarax.  She took the prednisone which did resolve her symptoms but then the symptoms reoccured a few days after stopping it. She again developed redness and itching around her lips, eyes, forehead and behind the ears with some swelling.  She was again given prednisone, Epi pen and clindamycin in the ER.  She used the prednisone and clindamycin which resolved her symptoms but then reoccured in July.  She was given benadryl, pepcid and decadron in ER and had improvement in symptoms.   Rhinitis:  Started around 2012 Symptoms include: nasal congestion, rhinorrhea, post nasal drainage, watery eyes, and itchy eyes  Occurs seasonally-Fall Potential triggers: smoking, pollen Treatments tried: Claritin or Zyrtec PRN; last use Thursday Previous allergy testing: no History of reflux/heartburn: no History of chronic sinusitis or sinus surgery: no  Past Medical History: Past Medical History:  Diagnosis Date   Anemia    in the past   Eczema     Past Surgical History: Past Surgical History:  Procedure Laterality Date   LAPAROSCOPIC TUBAL LIGATION  03/29/2011   Procedure: LAPAROSCOPIC TUBAL  LIGATION;  Surgeon: Roseanna Rainbow, MD;  Location: [facility];  Service: Gynecology;  Laterality: Bilateral;   NO PAST SURGERIES     WISDOM TOOTH EXTRACTION Bilateral    upper    Family History: Family History  Problem Relation Age of Onset   Eczema Mother    Diabetes Mother    Heart disease Mother    Heart disease Father     Social History:  Lives in a 20 year apartment Flooring in bedroom: carpet Pets: none Tobacco use/exposure: >10 year current smoker; highest is ppd Job: customer service  Medication List:  Allergies as of 04/01/2022       Reactions   Doxycycline Hives, Itching, Swelling   Penicillins Cross Reactors Hives, Itching, Swelling   Has patient had a PCN reaction causing immediate rash, facial/tongue/throat swelling, SOB or lightheadedness with hypotension:yes Has patient had a PCN reaction causing severe rash involving mucus membranes or skin necrosis: no Has patient had a PCN reaction that required hospitalization- yes Has patient had a PCN reaction occurring within the last 10 years: yes, 7-8 yrs ago If all of the above answers are "NO", then may proceed with Cephalosporin use.   Sulfa Drugs Cross Reactors Hives, Itching, Swelling   Aspirin Hives, Rash        Medication List        Accurate as of April 01, 2022 12:37 PM. If you have any questions, ask your nurse or doctor.          STOP taking these medications  benzonatate 100 MG capsule Commonly known as: TESSALON Stopped by: [name], MD   diphenhydrAMINE 25 mg capsule Commonly known as: BENADRYL Stopped by: [name], MD   famotidine 20 MG tablet Commonly known as: PEPCID Stopped by: [name], MD   predniSONE 20 MG tablet Commonly known as: DELTASONE Stopped by: [name], MD   Prenatal 27-1 MG Tabs Stopped by: [name], MD   ranitidine 150 MG tablet Commonly known as: ZANTAC Stopped by: [name], MD   Vitamin D (Ergocalciferol) 1.25 MG  (50000 UNIT) Caps capsule Commonly known as: DRISDOL Stopped by: [name], MD       TAKE these medications    EPINEPHrine 0.3 mg/0.3 mL Soaj injection Commonly known as: EPI-PEN Inject 0.3 mg into the muscle as needed for anaphylaxis.   fluticasone 50 MCG/ACT nasal spray Commonly known as: FLONASE Place 1 spray into both nostrils daily for 14 days.   ibuprofen 800 MG tablet Commonly known as: ADVIL Take 1 tablet (800 mg total) by mouth every 8 (eight) hours as needed.   triamcinolone lotion 0.1 % Commonly known as: KENALOG Apply 1 application. topically 2 (two) times daily. Apply to ears twice daily.  Avoid use around eyes.         REVIEW OF SYSTEMS: Pertinent positives and negatives discussed in HPI.   Objective:   Physical Exam: BP 112/80   Pulse 83   Temp 98.5 F (36.9 C) (Temporal)   Resp 16   Ht 5' 7.8" (1.722 m)   Wt 204 lb 6.4 oz (92.7 kg)   LMP 03/05/2022 (Exact Date)   SpO2 97%   BMI 31.27 kg/m  Body mass index is 31.27 kg/m. GEN: alert, well developed HEENT: clear conjunctiva, TM grey and translucent, nose with + inferior turbinate hypertrophy, pale nasal mucosa, slight clear rhinorrhea, + cobblestoning HEART: regular rate and rhythm, no murmur LUNGS: clear to auscultation bilaterally, no coughing, unlabored respiration ABDOMEN: soft, non distended  SKIN: no rashes or lesions  Reviewed:  3 ER visits as discussed in HPI.   Skin Testing:  Skin prick testing was placed, which includes aeroallergens/foods, histamine control, and saline control.  Verbal consent was obtained prior to placing test.  We discussed risks including anaphylaxis. Patient tolerated procedure well.  Allergy testing results were read and interpreted by myself, documented by clinical staff. Adequate positive and negative control.  Results discussed with patient/family.  Airborne Adult Perc - 04/01/22 1025     Time Antigen Placed 1025    Location Back    Number of Test  59    Panel 1 Select    2. Control-Histamine 1 mg/ml 3+    4. Bahia Negative    5. French Southern Territories Negative    6. Johnson Negative    7. Kentucky Blue Negative    8. Meadow Fescue Negative    9. Perennial Rye Negative    10. Sweet Vernal Negative    11. Timothy Negative    12. Cocklebur Negative    13. Burweed Marshelder Negative    14. Ragweed, short Negative    15. Ragweed, Giant Negative    16. Plantain,  English Negative    17. Lamb's Quarters Negative    18. Sheep Sorrell Negative    19. Rough Pigweed Negative    20. Marsh Elder, Rough Negative    21. Mugwort, Common Negative    22. Ash mix Negative    23. Charletta Cousin mix Negative  24. Beech American Negative    25. Box, Elder Negative    26. Cedar, red Negative    27. Cottonwood, Guinea-Bissau Negative    28. Elm mix Negative    29. Hickory Negative    30. Maple mix Negative    31. Oak, Guinea-Bissau mix Negative    32. Pecan Pollen Negative    33. Pine mix Negative    34. Sycamore Eastern Negative    35. Walnut, Black Pollen Negative    36. Alternaria alternata Negative    37. Cladosporium Herbarum Negative    38. Aspergillus mix Negative    39. Penicillium mix Negative    40. Bipolaris sorokiniana (Helminthosporium) Negative    41. Drechslera spicifera (Curvularia) Negative    42. Mucor plumbeus Negative    43. Fusarium moniliforme Negative    44. Aureobasidium pullulans (pullulara) Negative    45. Rhizopus oryzae Negative    46. Botrytis cinera Negative    47. Epicoccum nigrum Negative    48. Phoma betae Negative    49. Candida Albicans Negative    50. Trichophyton mentagrophytes Negative    51. Mite, D Farinae  5,000 AU/ml Negative    52. Mite, D Pteronyssinus  5,000 AU/ml Negative    53. Cat Hair 10,000 BAU/ml Negative    54.  Dog Epithelia Negative    55. Mixed Feathers Negative    56. Horse Epithelia Negative    57. Cockroach, German Negative    58. Mouse --   1+   59. Tobacco Leaf Negative              Intradermal - 04/01/22 1149     Time Antigen Placed 1149    Allergen Manufacturer Waynette Buttery    Location Arm    Number of Test 15    Control Negative    French Southern Territories Negative    Johnson Negative    7 Grass Negative    Ragweed mix Negative    Weed mix Negative    Tree mix Negative    Mold 1 Negative    Mold 2 Negative    Mold 3 Negative    Mold 4 Negative    Cat Negative    Dog Negative    Cockroach Negative    Mite mix Negative               Assessment:   1. Allergic dermatitis due to other chemical product   2. Mixed rhinitis   3. Allergic conjunctivitis of both eyes     Plan/Recommendations:   Allergic Contact Dermatitis - Based on symptoms and timeline of reaction, this does not sound like an IgE mediated process but rather delayed, likely contact dermatitis to a chemical that is in her product which is why she keeps having reoccurence.  Recommend patch testing to identify trigger and figure out a safe list.  - Use gentle, unscented cleansers and moisturizers only in the meantime.   - For acute flare ups, try hydrocortisone 2.5% daily.  Avoid using it for more than 7 days at a time.   Mixed Rhinitis Allergic Conjunctivitis - SPT 03/2022 to mouse - Use nasal saline rinses before nose sprays such as with Neilmed Sinus Rinse.  Use distilled water.   - Use Flonase 2 sprays each nostril daily. Aim upward and outward. - Use Zyrtec 10 mg daily as needed.     Appointment: Patch testing whenever able  And follow up in 2 months for regular visit.  Return in about 8 weeks (around 05/27/2022).  Alesia Morin, MD [facility]

## 2022-04-11 ENCOUNTER — Other Ambulatory Visit (HOSPITAL_COMMUNITY): Payer: Self-pay

## 2022-04-15 ENCOUNTER — Ambulatory Visit: Payer: Medicaid Other | Admitting: Internal Medicine

## 2022-04-17 ENCOUNTER — Ambulatory Visit: Payer: Medicaid Other | Admitting: Family

## 2022-04-19 ENCOUNTER — Ambulatory Visit: Payer: Medicaid Other | Admitting: Family Medicine

## 2022-05-02 ENCOUNTER — Other Ambulatory Visit: Payer: Self-pay | Admitting: Family Medicine

## 2022-05-02 DIAGNOSIS — N944 Primary dysmenorrhea: Secondary | ICD-10-CM

## 2022-06-03 ENCOUNTER — Ambulatory Visit: Payer: Medicaid Other | Admitting: Internal Medicine

## 2022-07-19 ENCOUNTER — Encounter: Payer: Medicaid Other | Admitting: Family Medicine

## 2023-04-17 ENCOUNTER — Other Ambulatory Visit: Payer: Self-pay

## 2023-04-17 ENCOUNTER — Emergency Department (HOSPITAL_COMMUNITY)
Admission: EM | Admit: 2023-04-17 | Discharge: 2023-04-17 | Disposition: A | Discharge status: Home / Self Care | Payer: Medicaid Other | Attending: Emergency Medicine | Admitting: Emergency Medicine

## 2023-04-17 ENCOUNTER — Encounter (HOSPITAL_COMMUNITY): Payer: Self-pay | Admitting: Emergency Medicine

## 2023-04-17 DIAGNOSIS — R21 Rash and other nonspecific skin eruption: Secondary | ICD-10-CM

## 2023-04-17 DIAGNOSIS — L309 Dermatitis, unspecified: Secondary | ICD-10-CM | POA: Insufficient documentation

## 2023-04-17 MED ORDER — DEXAMETHASONE SODIUM PHOSPHATE 10 MG/ML IJ SOLN
10.0000 mg | Freq: Once | INTRAMUSCULAR | Status: AC
  Administered 2023-04-17: 10 mg via INTRAMUSCULAR
  Filled 2023-04-17: qty 1

## 2023-04-17 MED ORDER — PREDNISONE 20 MG PO TABS: 40.0000 mg | ORAL_TABLET | Freq: Every day | ORAL | 0 refills | Status: AC

## 2023-04-17 NOTE — Discharge Instructions (Signed)
I have prescribed you steroids.  Please start taking this tomorrow.  The shot of steroids you got today will help you majority of today.  I would like for you to follow-up with your primary care doctor.  I have also given you follow-up with a dermatologist.

## 2023-04-17 NOTE — ED Triage Notes (Signed)
Pt reports she colored her hair on Sunday. Her hair began to fall out on Monday, her face and ears are warm, red and swollen. Her scalp feels tight and itching. Pt dyed eyebrows as well, which are red and swollen. Pt has been taking benadryl and hydrocortisone cream. Neither of those provided much relief. A similar reaction has happened in the past. The patient used Clairol.

## 2023-04-17 NOTE — ED Provider Notes (Signed)
[place] EMERGENCY DEPARTMENT AT [facility] Provider Note   CSN: [id number] Arrival date & time: 04/17/23  9562     History Chief Complaint  Patient presents with   Allergic Reaction    Elizabeth Alvarez is a 37 y.o. female patient who presents to the emergency department with an allergic reaction to the scalp that started roughly 3 days ago.  Patient states that she was at her hairstylist getting her hair colored including her eyebrows.  She states that roughly 2 days after receiving that treatment her hair started falling out and her scalp became very pruritic and erythematous.  She denies any respiratory symptoms including shortness of breath or chest pain, trouble swallowing, any visual disturbances. She denies fever or chills.    Allergic Reaction      Home Medications Prior to Admission medications   Medication Sig Start Date End Date Taking? Authorizing Provider  predniSONE (DELTASONE) 20 MG tablet Take 2 tablets (40 mg total) by mouth daily. 04/17/23  Yes Meredeth Ide, Shay Bartoli M, PA-C  cetirizine (ZYRTEC) 10 MG tablet Take 1 tablet (10 mg total) by mouth daily. 04/01/22   [name], MD  EPINEPHrine 0.3 mg/0.3 mL IJ SOAJ injection Inject 0.3 mg into the muscle as needed for anaphylaxis. 10/25/21   [name], PA-C  fluticasone (FLONASE) 50 MCG/ACT nasal spray Place 2 sprays into both nostrils daily. 04/01/22   [name], MD  hydrocortisone 2.5 % ointment Use twice daily for flare ups only. Maximum 7 days. 04/01/22   [name], MD  ibuprofen (ADVIL) 800 MG tablet TAKE 1 TABLET BY MOUTH EVERY 8 HOURS AS NEEDED 05/03/22   [name], FNP  triamcinolone lotion (KENALOG) 0.1 % Apply 1 application. topically 2 (two) times daily. Apply to ears twice daily.  Avoid use around eyes. 10/07/21   Burgess Amor, PA-C      Allergies    Doxycycline, Penicillins cross reactors, Sulfa drugs cross reactors, Rehyla hair + body cleanser [basis cleanser], and  Aspirin    Review of Systems   Review of Systems  All other systems reviewed and are negative.   Physical Exam Updated Vital Signs BP 122/81   Pulse 76   Temp 98.3 F (36.8 C) (Oral)   Resp 18   Ht 5\' 7"  (1.702 m)   Wt 80.3 kg   LMP 03/14/2023   SpO2 99%   BMI 27.72 kg/m  Physical Exam Vitals and nursing note reviewed.  Constitutional:      Appearance: Normal appearance.  HENT:     Head: Normocephalic and atraumatic.     Comments: Diffuse coalescing macular rash to the scalp and bilateral eyebrows.  There is evidence of excoriations over the pinna of the right ear.  No obvious purulence on this rash or on the right ear. Eyes:     General:        Right eye: No discharge.        Left eye: No discharge.     Conjunctiva/sclera: Conjunctivae normal.  Pulmonary:     Effort: Pulmonary effort is normal.     Breath sounds: Normal breath sounds. No stridor. No decreased breath sounds.  Skin:    General: Skin is warm and dry.     Findings: No rash.  Neurological:     General: No focal deficit present.     Mental Status: She is alert.  Psychiatric:        Mood and Affect: Mood normal.  Behavior: Behavior normal.     ED Results / Procedures / Treatments   Labs (all labs ordered are listed, but only abnormal results are displayed) Labs Reviewed - No data to display  EKG None  Radiology No results found.  Procedures Procedures    Medications Ordered in ED Medications  dexamethasone (DECADRON) injection 10 mg (10 mg Intramuscular Given 04/17/23 1015)    ED Course/ Medical Decision Making/ A&P   {   Click here for ABCD2, HEART and other calculators   Medical Decision Making Elizabeth Alvarez is a 37 y.o. female patient who presents to the emergency department today for further evaluation of allergic reaction.  Patient does not have any signs of systemic anaphylactic reaction at this time.  This seems to be localized to the scalp and face.  I am  planning on giving the patient some Decadron and a steroid burst which she can start tomorrow.  She is already taking Benadryl and hydrocortisone cream.  I will have her follow-up with her primary care doctor and give her a referral to dermatology here in [place].  Patient is amenable to this plan. She is safe for discharge at this time.      Final Clinical Impression(s) / ED Diagnoses Final diagnoses:  Rash  Dermatitis    Rx / DC Orders ED Discharge Orders          Ordered    predniSONE (DELTASONE) 20 MG tablet  Daily        04/17/23 1020              Honor Loh [place], [place] 04/17/23 1059    Franne Forts, DO 04/18/23 2258

## 2023-04-29 ENCOUNTER — Encounter (HOSPITAL_COMMUNITY): Payer: Self-pay

## 2023-04-29 ENCOUNTER — Emergency Department (HOSPITAL_COMMUNITY)
Admission: EM | Admit: 2023-04-29 | Discharge: 2023-04-29 | Disposition: A | Discharge status: Home / Self Care | Payer: Medicaid Other | Attending: Emergency Medicine | Admitting: Emergency Medicine

## 2023-04-29 ENCOUNTER — Other Ambulatory Visit: Payer: Self-pay

## 2023-04-29 DIAGNOSIS — T7840XD Allergy, unspecified, subsequent encounter: Secondary | ICD-10-CM

## 2023-04-29 DIAGNOSIS — T7840XA Allergy, unspecified, initial encounter: Secondary | ICD-10-CM | POA: Diagnosis not present

## 2023-04-29 MED ORDER — TRIAMCINOLONE ACETONIDE 0.1 % EX CREA: 1.0000 | TOPICAL_CREAM | Freq: Two times a day (BID) | CUTANEOUS | 0 refills | Status: AC | PRN

## 2023-04-29 NOTE — Discharge Instructions (Signed)
Evaluation today for your allergic reaction revealed that your symptoms have improved significantly.  I did see the redness on the right side of your nose.  I sent some triamcinolone cream which is a steroid cream to your pharmacy.  You can apply it as needed.  Recommend you follow-up with your PCP.

## 2023-04-29 NOTE — ED Notes (Signed)
Pt complains of on going rash on the side of nose from hair dye  Pain 1/10 Noted redness to the RIGHT side of nose

## 2023-04-29 NOTE — ED Provider Notes (Signed)
[place] EMERGENCY DEPARTMENT AT [facility] Provider Note   CSN: [id number] Arrival date & time: 04/29/23  1505     History  Chief Complaint  Patient presents with   Allergic Reaction Follow-Up   HPI Elizabeth Alvarez is a 37 y.o. female presenting for follow-up for allergic reaction.  Was here on October 31 and diagnosed with contact dermatitis after dying her hair.  States the rash is much improved states she may have a little bit of rash on the right side of her nose but otherwise states she is doing well without any other medical complaints.  Requesting a work note.  HPI     Home Medications Prior to Admission medications   Medication Sig Start Date End Date Taking? Authorizing Provider  triamcinolone cream (KENALOG) 0.1 % Apply 1 Application topically 2 (two) times daily as needed. 04/29/23  Yes [name], PA-C  cetirizine (ZYRTEC) 10 MG tablet Take 1 tablet (10 mg total) by mouth daily. 04/01/22   [name], MD  EPINEPHrine 0.3 mg/0.3 mL IJ SOAJ injection Inject 0.3 mg into the muscle as needed for anaphylaxis. 10/25/21   [name], PA-C  fluticasone (FLONASE) 50 MCG/ACT nasal spray Place 2 sprays into both nostrils daily. 04/01/22   [name], MD  hydrocortisone 2.5 % ointment Use twice daily for flare ups only. Maximum 7 days. 04/01/22   [name], MD  ibuprofen (ADVIL) 800 MG tablet TAKE 1 TABLET BY MOUTH EVERY 8 HOURS AS NEEDED 05/03/22   [name], FNP  predniSONE (DELTASONE) 20 MG tablet Take 2 tablets (40 mg total) by mouth daily. 04/17/23   [name], PA-C      Allergies    Doxycycline, Penicillins cross reactors, Sulfa drugs cross reactors, Rehyla hair + body cleanser [basis cleanser], and Aspirin    Review of Systems   Review of Systems  Skin:  Positive for rash.    Physical Exam Updated Vital Signs BP 122/85 (BP Location: Left Arm)   Pulse 96   Temp 98.8 F (37.1 C) (Oral)   Resp 17   Ht 5'  7" (1.702 m)   Wt 80.3 kg   LMP 04/21/2023   SpO2 96%   BMI 27.72 kg/m  Physical Exam Constitutional:      Appearance: Normal appearance.  HENT:     Head: Normocephalic.     Nose: Nose normal.  Eyes:     Conjunctiva/sclera: Conjunctivae normal.  Pulmonary:     Effort: Pulmonary effort is normal.  Skin:    Comments: Small area of erythema about the right side of the nose with evidence of excoriation otherwise no visible rash around the scalp or posterior head.  Neurological:     Mental Status: She is alert.  Psychiatric:        Mood and Affect: Mood normal.     ED Results / Procedures / Treatments   Labs (all labs ordered are listed, but only abnormal results are displayed) Labs Reviewed - No data to display  EKG None  Radiology No results found.  Procedures Procedures    Medications Ordered in ED Medications - No data to display  ED Course/ Medical Decision Making/ A&P                                 Medical Decision Making  37 year old well-appearing female presenting for follow-up of allergic reaction.  Exam notable for mild redness about the right near with evidence of excoriation.  Otherwise appears that her symptoms have improved significantly since she was here at the end of October.  Sent some triamcinolone cream to her pharmacy for lingering rash about her nose.  Advised her to follow-up PCP.  Vital stable discharge good condition.  Also provided work note for today.        Final Clinical Impression(s) / ED Diagnoses Final diagnoses:  Allergic reaction, subsequent encounter    Rx / DC Orders ED Discharge Orders          Ordered    triamcinolone cream (KENALOG) 0.1 %  2 times daily PRN        04/29/23 1721              [name], PA-C 04/29/23 Maurine Cane, MD 05/02/23 1739

## 2023-04-29 NOTE — ED Triage Notes (Signed)
Pt wants f/u for allergic to hair dye, irritation to face and behind ears, called out today, needs a work note.

## 2023-07-31 ENCOUNTER — Encounter: Payer: Self-pay | Admitting: Family Medicine

## 2023-08-14 ENCOUNTER — Encounter: Payer: Self-pay | Admitting: Family Medicine

## 2024-06-06 ENCOUNTER — Emergency Department (HOSPITAL_COMMUNITY)
Admission: EM | Admit: 2024-06-06 | Discharge: 2024-06-06 | Disposition: A | Discharge status: Home / Self Care | Attending: Emergency Medicine | Admitting: Emergency Medicine

## 2024-06-06 ENCOUNTER — Other Ambulatory Visit: Payer: Self-pay

## 2024-06-06 ENCOUNTER — Emergency Department (HOSPITAL_COMMUNITY)

## 2024-06-06 ENCOUNTER — Encounter (HOSPITAL_COMMUNITY): Payer: Self-pay

## 2024-06-06 DIAGNOSIS — S20221A Contusion of right back wall of thorax, initial encounter: Secondary | ICD-10-CM | POA: Diagnosis not present

## 2024-06-06 DIAGNOSIS — S60221A Contusion of right hand, initial encounter: Secondary | ICD-10-CM | POA: Diagnosis not present

## 2024-06-06 DIAGNOSIS — I671 Cerebral aneurysm, nonruptured: Secondary | ICD-10-CM

## 2024-06-06 DIAGNOSIS — S1083XA Contusion of other specified part of neck, initial encounter: Secondary | ICD-10-CM | POA: Diagnosis not present

## 2024-06-06 DIAGNOSIS — S40011A Contusion of right shoulder, initial encounter: Secondary | ICD-10-CM | POA: Diagnosis not present

## 2024-06-06 DIAGNOSIS — S01551A Open bite of lip, initial encounter: Secondary | ICD-10-CM | POA: Diagnosis not present

## 2024-06-06 DIAGNOSIS — S0990XA Unspecified injury of head, initial encounter: Secondary | ICD-10-CM

## 2024-06-06 DIAGNOSIS — S8011XA Contusion of right lower leg, initial encounter: Secondary | ICD-10-CM | POA: Insufficient documentation

## 2024-06-06 DIAGNOSIS — W503XXA Accidental bite by another person, initial encounter: Secondary | ICD-10-CM | POA: Diagnosis not present

## 2024-06-06 DIAGNOSIS — S060XAA Concussion with loss of consciousness status unknown, initial encounter: Secondary | ICD-10-CM | POA: Insufficient documentation

## 2024-06-06 LAB — CBC WITH DIFFERENTIAL/PLATELET
Abs Immature Granulocytes: 0.01 K/uL (ref 0.00–0.07)
Basophils Absolute: 0.1 K/uL (ref 0.0–0.1)
Basophils Relative: 1 %
Eosinophils Absolute: 0.2 K/uL (ref 0.0–0.5)
Eosinophils Relative: 3 %
HCT: 42.3 % (ref 36.0–46.0)
Hemoglobin: 13.8 g/dL (ref 12.0–15.0)
Immature Granulocytes: 0 %
Lymphocytes Relative: 32 %
Lymphs Abs: 2.2 K/uL (ref 0.7–4.0)
MCH: 33.6 pg (ref 26.0–34.0)
MCHC: 32.6 g/dL (ref 30.0–36.0)
MCV: 102.9 fL — ABNORMAL HIGH (ref 80.0–100.0)
Monocytes Absolute: 0.5 K/uL (ref 0.1–1.0)
Monocytes Relative: 7 %
Neutro Abs: 3.8 K/uL (ref 1.7–7.7)
Neutrophils Relative %: 57 %
Platelets: 250 K/uL (ref 150–400)
RBC: 4.11 MIL/uL (ref 3.87–5.11)
RDW: 12.8 % (ref 11.5–15.5)
WBC: 6.8 K/uL (ref 4.0–10.5)
nRBC: 0 % (ref 0.0–0.2)

## 2024-06-06 LAB — COMPREHENSIVE METABOLIC PANEL WITH GFR
ALT: 13 U/L (ref 0–44)
AST: 20 U/L (ref 15–41)
Albumin: 4.2 g/dL (ref 3.5–5.0)
Alkaline Phosphatase: 62 U/L (ref 38–126)
Anion gap: 12 (ref 5–15)
BUN: 15 mg/dL (ref 6–20)
CO2: 25 mmol/L (ref 22–32)
Calcium: 8.6 mg/dL — ABNORMAL LOW (ref 8.9–10.3)
Chloride: 106 mmol/L (ref 98–111)
Creatinine, Ser: 0.73 mg/dL (ref 0.44–1.00)
GFR, Estimated: >60 mL/min
Glucose, Bld: 85 mg/dL (ref 70–99)
Potassium: 3.6 mmol/L (ref 3.5–5.1)
Sodium: 143 mmol/L (ref 135–145)
Total Bilirubin: 0.2 mg/dL (ref 0.0–1.2)
Total Protein: 6.7 g/dL (ref 6.5–8.1)

## 2024-06-06 LAB — HEPATITIS B SURFACE ANTIGEN: Hepatitis B Surface Ag: NONREACTIVE

## 2024-06-06 LAB — RAPID HIV SCREEN (HIV 1/2 AB+AG)
HIV 1/2 Antibodies: NONREACTIVE
HIV-1 P24 Antigen - HIV24: NONREACTIVE

## 2024-06-06 LAB — LIPASE, BLOOD: Lipase: 35 U/L (ref 11–51)

## 2024-06-06 LAB — HEPATITIS C ANTIBODY: HCV Ab: NONREACTIVE

## 2024-06-06 MED ORDER — CLINDAMYCIN HCL 300 MG PO CAPS: 300.0000 mg | ORAL_CAPSULE | Freq: Three times a day (TID) | ORAL | 0 refills | Status: AC

## 2024-06-06 MED ORDER — IOHEXOL 350 MG/ML SOLN
75.0000 mL | Freq: Once | INTRAVENOUS | Status: AC | PRN
  Administered 2024-06-06: 75 mL via INTRAVENOUS

## 2024-06-06 MED ORDER — CIPROFLOXACIN HCL 500 MG PO TABS: 500.0000 mg | ORAL_TABLET | Freq: Two times a day (BID) | ORAL | 0 refills | Status: AC
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
## 2024-06-07 LAB — SYPHILIS: RPR W/REFLEX TO RPR TITER AND TREPONEMAL ANTIBODIES, TRADITIONAL SCREENING AND DIAGNOSIS ALGORITHM: RPR Ser Ql: NONREACTIVE
